# Patient Record
Sex: Male | Born: 1935 | Race: White | Hispanic: No | Marital: Married | Smoking: Former smoker
Health system: Southern US, Community
[De-identification: ages and names within clinical notes are randomized; demographics above are authoritative.]

## PROBLEM LIST (undated history)

## (undated) DIAGNOSIS — I350 Nonrheumatic aortic (valve) stenosis: Secondary | ICD-10-CM

## (undated) DIAGNOSIS — I10 Essential (primary) hypertension: Secondary | ICD-10-CM

## (undated) DIAGNOSIS — E785 Hyperlipidemia, unspecified: Secondary | ICD-10-CM

## (undated) DIAGNOSIS — I441 Atrioventricular block, second degree: Secondary | ICD-10-CM

## (undated) DIAGNOSIS — R413 Other amnesia: Secondary | ICD-10-CM

## (undated) DIAGNOSIS — Z950 Presence of cardiac pacemaker: Secondary | ICD-10-CM

## (undated) HISTORY — DX: Other amnesia: R41.3

## (undated) HISTORY — PX: APPENDECTOMY: SHX54

## (undated) HISTORY — DX: Hyperlipidemia, unspecified: E78.5

## (undated) HISTORY — DX: Nonrheumatic aortic (valve) stenosis: I35.0

## (undated) HISTORY — DX: Atrioventricular block, second degree: I44.1

## (undated) HISTORY — DX: Essential (primary) hypertension: I10

## (undated) HISTORY — DX: Presence of cardiac pacemaker: Z95.0

---

## 2004-09-04 ENCOUNTER — Ambulatory Visit: Payer: Self-pay | Admitting: Internal Medicine

## 2004-10-10 ENCOUNTER — Ambulatory Visit: Payer: Self-pay | Admitting: Internal Medicine

## 2005-01-16 ENCOUNTER — Ambulatory Visit: Payer: Self-pay | Admitting: Internal Medicine

## 2005-02-12 ENCOUNTER — Ambulatory Visit: Payer: Self-pay | Admitting: Internal Medicine

## 2005-07-14 ENCOUNTER — Ambulatory Visit: Payer: Self-pay | Admitting: Internal Medicine

## 2005-08-14 ENCOUNTER — Ambulatory Visit: Payer: Self-pay | Admitting: Internal Medicine

## 2005-09-25 ENCOUNTER — Ambulatory Visit: Payer: Self-pay | Admitting: Internal Medicine

## 2005-11-26 ENCOUNTER — Ambulatory Visit: Payer: Self-pay | Admitting: Internal Medicine

## 2006-02-23 ENCOUNTER — Ambulatory Visit: Payer: Self-pay | Admitting: Internal Medicine

## 2006-07-21 ENCOUNTER — Ambulatory Visit: Payer: Self-pay | Admitting: Internal Medicine

## 2006-07-28 ENCOUNTER — Ambulatory Visit: Payer: Self-pay | Admitting: Internal Medicine

## 2006-10-27 ENCOUNTER — Ambulatory Visit: Payer: Self-pay | Admitting: Internal Medicine

## 2006-10-27 LAB — CONVERTED CEMR LAB: LDL DIRECT: 195 mg/dL

## 2006-11-03 ENCOUNTER — Ambulatory Visit: Payer: Self-pay | Admitting: Internal Medicine

## 2006-12-28 ENCOUNTER — Ambulatory Visit: Payer: Self-pay | Admitting: Internal Medicine

## 2006-12-28 LAB — CONVERTED CEMR LAB
Bilirubin, Direct: 0.1 mg/dL (ref 0.0–0.3)
Cholesterol: 300 mg/dL (ref 0–200)
HDL: 64.5 mg/dL (ref >39.0)
Total CHOL/HDL Ratio: 4.7
Total Protein: 6.1 g/dL (ref 6.0–8.3)
Triglycerides: 110 mg/dL (ref 0–149)

## 2007-01-04 ENCOUNTER — Ambulatory Visit: Payer: Self-pay | Admitting: Internal Medicine

## 2007-03-15 ENCOUNTER — Ambulatory Visit: Payer: Self-pay | Admitting: Internal Medicine

## 2007-03-15 LAB — CONVERTED CEMR LAB
Cholesterol: 284 mg/dL (ref 0–200)
HDL: 58.2 mg/dL (ref >39.0)

## 2007-03-22 ENCOUNTER — Ambulatory Visit: Payer: Self-pay | Admitting: Internal Medicine

## 2007-05-06 ENCOUNTER — Encounter: Payer: Self-pay | Admitting: Internal Medicine

## 2007-05-24 ENCOUNTER — Ambulatory Visit: Payer: Self-pay | Admitting: Internal Medicine

## 2007-05-24 DIAGNOSIS — E785 Hyperlipidemia, unspecified: Secondary | ICD-10-CM | POA: Insufficient documentation

## 2007-05-24 DIAGNOSIS — T887XXA Unspecified adverse effect of drug or medicament, initial encounter: Secondary | ICD-10-CM

## 2007-05-24 LAB — CONVERTED CEMR LAB
Albumin: 3.6 g/dL (ref 3.5–5.2)
Alkaline Phosphatase: 46 units/L (ref 39–117)
CRP, High Sensitivity: 1 (ref 0.00–5.00)
Direct LDL: 168.8 mg/dL
HDL: 61.8 mg/dL (ref >39.0)
Hemoglobin: 14.7 g/dL (ref 13.0–17.0)
Total CHOL/HDL Ratio: 4.2
Total Protein: 6.5 g/dL (ref 6.0–8.3)
Triglycerides: 107 mg/dL (ref 0–149)

## 2007-05-31 ENCOUNTER — Ambulatory Visit: Payer: Self-pay | Admitting: Internal Medicine

## 2007-05-31 ENCOUNTER — Telehealth: Payer: Self-pay | Admitting: Internal Medicine

## 2007-05-31 DIAGNOSIS — R413 Other amnesia: Secondary | ICD-10-CM

## 2007-05-31 DIAGNOSIS — I10 Essential (primary) hypertension: Secondary | ICD-10-CM | POA: Insufficient documentation

## 2007-08-24 ENCOUNTER — Ambulatory Visit: Payer: Self-pay | Admitting: Internal Medicine

## 2007-08-24 LAB — CONVERTED CEMR LAB
ALT: 16 units/L (ref 0–53)
Basophils Relative: 0.9 % (ref 0.0–1.0)
Bilirubin, Direct: 0.1 mg/dL (ref 0.0–0.3)
CO2: 32 meq/L (ref 19–32)
Calcium: 9.6 mg/dL (ref 8.4–10.5)
Eosinophils Absolute: 0.4 10*3/uL (ref 0.0–0.6)
Eosinophils Relative: 6.5 % — ABNORMAL HIGH (ref 0.0–5.0)
GFR calc Af Amer: 94 mL/min
GFR calc non Af Amer: 78 mL/min
Glucose, Bld: 99 mg/dL (ref 70–99)
HDL: 58.6 mg/dL (ref >39.0)
Hemoglobin: 15.3 g/dL (ref 13.0–17.0)
Ketones, urine, test strip: NEGATIVE
Lymphocytes Relative: 23.1 % (ref 12.0–46.0)
MCV: 99 fL (ref 78.0–100.0)
Monocytes Absolute: 1 10*3/uL — ABNORMAL HIGH (ref 0.2–0.7)
Neutro Abs: 2.9 10*3/uL (ref 1.4–7.7)
Neutrophils Relative %: 51.6 % (ref 43.0–77.0)
Nitrite: NEGATIVE
PSA: 0.22 ng/mL (ref 0.10–4.00)
Platelets: 195 10*3/uL (ref 150–400)
Potassium: 5.2 meq/L — ABNORMAL HIGH (ref 3.5–5.1)
Sodium: 143 meq/L (ref 135–145)
Specific Gravity, Urine: 1.025
TSH: 1.92 microintl units/mL (ref 0.35–5.50)
Total Protein: 6.3 g/dL (ref 6.0–8.3)
VLDL: 24 mg/dL (ref 0–40)
WBC: 5.7 10*3/uL (ref 4.5–10.5)
pH: 5.5

## 2007-08-30 ENCOUNTER — Ambulatory Visit: Payer: Self-pay | Admitting: Internal Medicine

## 2007-11-22 ENCOUNTER — Ambulatory Visit: Payer: Self-pay | Admitting: Internal Medicine

## 2007-11-22 LAB — CONVERTED CEMR LAB
ALT: 15 units/L (ref 0–53)
AST: 18 units/L (ref 0–37)
Alkaline Phosphatase: 48 units/L (ref 39–117)
Bilirubin, Direct: 0.2 mg/dL (ref 0.0–0.3)
Cholesterol: 288 mg/dL (ref 0–200)
Total CHOL/HDL Ratio: 4.6
Total Protein: 6.2 g/dL (ref 6.0–8.3)

## 2007-12-01 ENCOUNTER — Ambulatory Visit: Payer: Self-pay | Admitting: Internal Medicine

## 2007-12-08 ENCOUNTER — Telehealth: Payer: Self-pay | Admitting: Internal Medicine

## 2008-02-23 ENCOUNTER — Ambulatory Visit: Payer: Self-pay | Admitting: Internal Medicine

## 2008-02-23 LAB — CONVERTED CEMR LAB
Direct LDL: 233.9 mg/dL
HDL: 53.7 mg/dL (ref >39.0)
TSH: 1.55 microintl units/mL (ref 0.35–5.50)
Total CHOL/HDL Ratio: 5.7
Triglycerides: 94 mg/dL (ref 0–149)

## 2008-03-01 ENCOUNTER — Ambulatory Visit: Payer: Self-pay | Admitting: Internal Medicine

## 2008-03-01 LAB — CONVERTED CEMR LAB: LDL Goal: 130 mg/dL

## 2008-05-29 ENCOUNTER — Ambulatory Visit: Payer: Self-pay | Admitting: Internal Medicine

## 2008-05-29 LAB — CONVERTED CEMR LAB
Alkaline Phosphatase: 45 units/L (ref 39–117)
Bilirubin, Direct: 0.1 mg/dL (ref 0.0–0.3)
HDL: 60.4 mg/dL (ref >39.0)
Total Bilirubin: 1.1 mg/dL (ref 0.3–1.2)
VLDL: 22 mg/dL (ref 0–40)

## 2008-06-05 ENCOUNTER — Ambulatory Visit: Payer: Self-pay | Admitting: Internal Medicine

## 2008-07-26 ENCOUNTER — Ambulatory Visit: Payer: Self-pay | Admitting: Internal Medicine

## 2008-09-10 ENCOUNTER — Ambulatory Visit: Payer: Self-pay | Admitting: Internal Medicine

## 2008-09-10 LAB — CONVERTED CEMR LAB
ALT: 15 units/L (ref 0–53)
Basophils Absolute: 0.1 10*3/uL (ref 0.0–0.1)
Bilirubin, Direct: 0.1 mg/dL (ref 0.0–0.3)
Blood in Urine, dipstick: NEGATIVE
CO2: 31 meq/L (ref 19–32)
Calcium: 9.4 mg/dL (ref 8.4–10.5)
Cholesterol: 296 mg/dL (ref 0–200)
Glucose, Urine, Semiquant: NEGATIVE
HDL: 57.4 mg/dL (ref >39.0)
Lymphocytes Relative: 28.9 % (ref 12.0–46.0)
MCHC: 33.9 g/dL (ref 30.0–36.0)
Neutro Abs: 2.2 10*3/uL (ref 1.4–7.7)
Neutrophils Relative %: 43.9 % (ref 43.0–77.0)
PSA: 0.33 ng/mL (ref 0.10–4.00)
Platelets: 178 10*3/uL (ref 150–400)
Potassium: 4.7 meq/L (ref 3.5–5.1)
RDW: 12.6 % (ref 11.5–14.6)
Sodium: 144 meq/L (ref 135–145)
TSH: 2.39 microintl units/mL (ref 0.35–5.50)
Total Bilirubin: 0.7 mg/dL (ref 0.3–1.2)
Triglycerides: 109 mg/dL (ref 0–149)
Urobilinogen, UA: 0.2
VLDL: 22 mg/dL (ref 0–40)
pH: 5.5

## 2008-09-17 ENCOUNTER — Ambulatory Visit: Payer: Self-pay | Admitting: Internal Medicine

## 2008-11-05 ENCOUNTER — Telehealth: Payer: Self-pay | Admitting: Internal Medicine

## 2008-11-05 ENCOUNTER — Encounter: Payer: Self-pay | Admitting: Internal Medicine

## 2009-01-01 ENCOUNTER — Ambulatory Visit: Payer: Self-pay | Admitting: Internal Medicine

## 2009-01-01 LAB — CONVERTED CEMR LAB
ALT: 16 units/L (ref 0–53)
Direct LDL: 163.1 mg/dL
HDL: 54.1 mg/dL (ref >39.00)
Total Bilirubin: 1.1 mg/dL (ref 0.3–1.2)
Total Protein: 6.1 g/dL (ref 6.0–8.3)
Triglycerides: 98 mg/dL (ref 0.0–149.0)

## 2009-01-17 ENCOUNTER — Ambulatory Visit: Payer: Self-pay | Admitting: Internal Medicine

## 2009-01-17 DIAGNOSIS — N401 Enlarged prostate with lower urinary tract symptoms: Secondary | ICD-10-CM | POA: Insufficient documentation

## 2009-04-16 ENCOUNTER — Telehealth: Payer: Self-pay | Admitting: Internal Medicine

## 2009-04-23 ENCOUNTER — Ambulatory Visit: Payer: Self-pay | Admitting: Internal Medicine

## 2009-04-23 LAB — CONVERTED CEMR LAB
BUN: 20 mg/dL (ref 6–23)
Calcium: 8.9 mg/dL (ref 8.4–10.5)
GFR calc non Af Amer: 87.11 mL/min (ref >60)
Glucose, Bld: 94 mg/dL (ref 70–99)
HDL: 61.7 mg/dL (ref >39.00)

## 2009-06-27 ENCOUNTER — Telehealth: Payer: Self-pay | Admitting: Internal Medicine

## 2009-07-15 ENCOUNTER — Telehealth: Payer: Self-pay | Admitting: Internal Medicine

## 2009-07-30 ENCOUNTER — Ambulatory Visit: Payer: Self-pay | Admitting: Internal Medicine

## 2009-10-16 ENCOUNTER — Ambulatory Visit: Payer: Self-pay | Admitting: Internal Medicine

## 2009-10-16 LAB — CONVERTED CEMR LAB
ALT: 14 units/L (ref 0–53)
Alkaline Phosphatase: 49 units/L (ref 39–117)
Basophils Relative: 0.8 % (ref 0.0–3.0)
Bilirubin, Direct: 0.2 mg/dL (ref 0.0–0.3)
Calcium: 9.1 mg/dL (ref 8.4–10.5)
Creatinine, Ser: 0.9 mg/dL (ref 0.4–1.5)
Direct LDL: 186.5 mg/dL
Eosinophils Absolute: 0.4 10*3/uL (ref 0.0–0.7)
Eosinophils Relative: 7.2 % — ABNORMAL HIGH (ref 0.0–5.0)
Lymphocytes Relative: 22.7 % (ref 12.0–46.0)
Neutrophils Relative %: 56.1 % (ref 43.0–77.0)
RBC: 4.46 M/uL (ref 4.22–5.81)
Total CHOL/HDL Ratio: 4
Total Protein: 6.6 g/dL (ref 6.0–8.3)
Triglycerides: 103 mg/dL (ref 0.0–149.0)
VLDL: 20.6 mg/dL (ref 0.0–40.0)
WBC: 5.9 10*3/uL (ref 4.5–10.5)

## 2010-01-14 ENCOUNTER — Ambulatory Visit: Payer: Self-pay | Admitting: Internal Medicine

## 2010-02-03 ENCOUNTER — Encounter: Payer: Self-pay | Admitting: Internal Medicine

## 2010-02-03 ENCOUNTER — Ambulatory Visit (HOSPITAL_COMMUNITY): Admission: RE | Admit: 2010-02-03 | Discharge: 2010-02-03 | Payer: Self-pay | Admitting: Internal Medicine

## 2010-02-03 ENCOUNTER — Ambulatory Visit: Payer: Self-pay | Admitting: Cardiology

## 2010-02-03 ENCOUNTER — Ambulatory Visit: Payer: Self-pay

## 2010-04-03 ENCOUNTER — Ambulatory Visit: Payer: Self-pay | Admitting: Internal Medicine

## 2010-04-03 LAB — CONVERTED CEMR LAB
ALT: 10 units/L (ref 0–53)
AST: 18 units/L (ref 0–37)
Alkaline Phosphatase: 46 units/L (ref 39–117)
Bilirubin, Direct: 0.2 mg/dL (ref 0.0–0.3)
HDL: 61 mg/dL (ref >39.00)
Total Protein: 6 g/dL (ref 6.0–8.3)

## 2010-04-15 ENCOUNTER — Ambulatory Visit: Payer: Self-pay | Admitting: Internal Medicine

## 2010-07-08 ENCOUNTER — Ambulatory Visit: Payer: Self-pay | Admitting: Internal Medicine

## 2010-07-08 LAB — CONVERTED CEMR LAB
ALT: 12 units/L (ref 0–53)
AST: 18 units/L (ref 0–37)
Alkaline Phosphatase: 51 units/L (ref 39–117)
Bilirubin, Direct: 0.1 mg/dL (ref 0.0–0.3)
CO2: 28 meq/L (ref 19–32)
Chloride: 108 meq/L (ref 96–112)
Cholesterol: 234 mg/dL — ABNORMAL HIGH (ref 0–200)
Potassium: 4.7 meq/L (ref 3.5–5.1)
Sodium: 142 meq/L (ref 135–145)
Total Bilirubin: 0.7 mg/dL (ref 0.3–1.2)
Total CHOL/HDL Ratio: 4
Total Protein: 5.9 g/dL — ABNORMAL LOW (ref 6.0–8.3)

## 2010-07-15 ENCOUNTER — Ambulatory Visit: Payer: Self-pay | Admitting: Internal Medicine

## 2010-10-14 ENCOUNTER — Ambulatory Visit: Payer: Self-pay | Admitting: Internal Medicine

## 2010-10-14 LAB — CONVERTED CEMR LAB
Albumin: 3.4 g/dL — ABNORMAL LOW (ref 3.5–5.2)
Alkaline Phosphatase: 51 units/L (ref 39–117)
Basophils Relative: 0.8 % (ref 0.0–3.0)
Bilirubin Urine: NEGATIVE
CO2: 30 meq/L (ref 19–32)
Chloride: 104 meq/L (ref 96–112)
Direct LDL: 219.1 mg/dL
Eosinophils Absolute: 0.5 10*3/uL (ref 0.0–0.7)
Hemoglobin: 15.3 g/dL (ref 13.0–17.0)
MCHC: 33.9 g/dL (ref 30.0–36.0)
MCV: 98.2 fL (ref 78.0–100.0)
Monocytes Absolute: 0.8 10*3/uL (ref 0.1–1.0)
Neutro Abs: 2.7 10*3/uL (ref 1.4–7.7)
Nitrite: NEGATIVE
RBC: 4.58 M/uL (ref 4.22–5.81)
Sodium: 142 meq/L (ref 135–145)
Total CHOL/HDL Ratio: 5
Total Protein: 6.1 g/dL (ref 6.0–8.3)
Urobilinogen, UA: 0.2

## 2010-10-27 ENCOUNTER — Ambulatory Visit
Admission: RE | Admit: 2010-10-27 | Discharge: 2010-10-27 | Payer: Self-pay | Source: Home / Self Care | Attending: Internal Medicine | Admitting: Internal Medicine

## 2010-10-27 DIAGNOSIS — J309 Allergic rhinitis, unspecified: Secondary | ICD-10-CM | POA: Insufficient documentation

## 2010-11-19 NOTE — Assessment & Plan Note (Signed)
Summary: 3 month rov/njr   Vital Signs:  Patient profile:   77 year old male Height:      67 inches Weight:      194 pounds BMI:     30.49 Temp:     98.2 degrees F oral Pulse rate:   56 / minute Resp:     14 per minute BP sitting:   120 / 78  (left arm)  Vitals Entered By: Issiah Eddy, LPN (July 15, 2010 9:58 AM)  Nutrition Counseling: Patient's BMI is greater than 25 and therefore counseled on weight management options. CC: roa labs, Lipid Management Is Patient Diabetic? No   Primary Care Tyrique Sporn:  Stacie Glaze MD  CC:  roa labs and Lipid Management.  History of Present Illness: Follow up change in the blood pressure lipids and reviewed memory disorder   Hyperlipidemia Follow-Up      This is a 77 year old man who presents for Hyperlipidemia follow-up.  changed to fish oil could not tolerate the statin class.  The patient denies muscle aches, GI upset, abdominal pain, flushing, itching, constipation, diarrhea, and fatigue.  The patient denies the following symptoms: chest pain/pressure, exercise intolerance, dypsnea, palpitations, syncope, and pedal edema.  Compliance with medications (by patient report) has been near 100%.  Dietary compliance has been fair.  The patient reports exercising occasionally.  Adjunctive measures currently used by the patient include niacin and fish oil supplements.    Lipid Management History:      Positive NCEP/ATP III risk factors include male age 65 years old or older and hypertension.  Negative NCEP/ATP III risk factors include non-tobacco-user status.     Preventive Screening-Counseling & Management  Alcohol-Tobacco     Smoking Status: quit > 6 months     Passive Smoke Exposure: yes     Tobacco Counseling: to remain off tobacco products     Passive Smoke Counseling: to avoid passive smoke exposure  Comments: never smoked continuously, at work  Problems Prior to Update: 1)  Murmur  (ICD-785.2) 2)  Benign Prostatic  Hypertrophy, With Obstruction  (ICD-600.01) 3)  Physical Examination  (ICD-V70.0) 4)  Symptom, Memory Loss  (ICD-780.93) 5)  Hypertension  (ICD-401.9) 6)  Hyperlipidemia  (ICD-272.4) 7)  Advef, Drug/medicinal/biological Subst Nos  (ICD-995.20)  Current Problems (verified): 1)  Murmur  (ICD-785.2) 2)  Benign Prostatic Hypertrophy, With Obstruction  (ICD-600.01) 3)  Physical Examination  (ICD-V70.0) 4)  Symptom, Memory Loss  (ICD-780.93) 5)  Hypertension  (ICD-401.9) 6)  Hyperlipidemia  (ICD-272.4) 7)  Advef, Drug/medicinal/biological Subst Nos  (ICD-995.20)  Medications Prior to Update: 1)  Gingo Biloba .... Once Daily 2)  Saw Palmetto .... Once Daily 3)  Calcium 600/vitamin D 600-200 Mg-Unit  Tabs (Calcium Carbonate-Vitamin D) .... Once Daily 4)  Bystolic 5 Mg Tabs (Nebivolol Hcl) .Marland Kitchen.. 1 Once Daily 5)  Livalo 2 Mg Tabs (Pitavastatin Calcium) .... One By Mouth Daily 6)  Phosphatidylserine 7)  Fish Oil Concentrate 1000 Mg Caps (Omega-3 Fatty Acids) .... Two By Mouth Two Times A Day 8)  Aricept 5 Mg Tabs (Donepezil Hcl) .... One By Mouth Daily 9)  Cerefolin Nac 6-2-600 Mg Tabs (Methylfol-Methylcob-Acetylcyst) .Marland Kitchen.. 1 Once Daily  Current Medications (verified): 1)  Gingo Biloba .... Once Daily 2)  Saw Palmetto .... Once Daily 3)  Calcium 600/vitamin D 600-200 Mg-Unit  Tabs (Calcium Carbonate-Vitamin D) .... Once Daily 4)  Bystolic 5 Mg Tabs (Nebivolol Hcl) .Marland Kitchen.. 1 Once Daily 5)  Livalo 2 Mg Tabs (Pitavastatin Calcium) .Marland KitchenMarland KitchenMarland Kitchen  One By Mouth Daily 6)  Phosphatidylserine 7)  Fish Oil Concentrate 1000 Mg Caps (Omega-3 Fatty Acids) .... Two By Mouth Two Times A Day 8)  Aricept 5 Mg Tabs (Donepezil Hcl) .... One By Mouth Daily 9)  Cerefolin Nac 6-2-600 Mg Tabs (Methylfol-Methylcob-Acetylcyst) .Marland Kitchen.. 1 Once Daily  Allergies (verified): 1)  Amoxicillin (Amoxicillin) 2)  * Statin  Past History:  Family History: Last updated: 05/31/2007 father passed at 66 mother  died at 22 brother with  memory problems  Social History: Last updated: 05/31/2007 Retired Married Former Smoker  Risk Factors: Smoking Status: quit > 6 months (07/15/2010) Passive Smoke Exposure: yes (07/15/2010)  Past medical, surgical, family and social histories (including risk factors) reviewed, and no changes noted (except as noted below).  Past Medical History: Reviewed history from 09/17/2008 and no changes required. Hyperlipidemia Hypertension memory loss  Past Surgical History: Reviewed history from 05/31/2007 and no changes required. Appendectomy  Family History: Reviewed history from 05/31/2007 and no changes required. father passed at 72 mother  died at 45 brother with memory problems  Social History: Reviewed history from 05/31/2007 and no changes required. Retired Married Former Smoker Smoking Status:  quit > 6 months Passive Smoke Exposure:  yes  Review of Systems  The patient denies anorexia, fever, weight loss, weight gain, vision loss, decreased hearing, hoarseness, chest pain, syncope, dyspnea on exertion, peripheral edema, prolonged cough, headaches, hemoptysis, abdominal pain, melena, hematochezia, severe indigestion/heartburn, hematuria, incontinence, genital sores, muscle weakness, suspicious skin lesions, transient blindness, difficulty walking, depression, unusual weight change, abnormal bleeding, enlarged lymph nodes, angioedema, and breast masses.         Flu Vaccine Consent Questions     Do you have a history of severe allergic reactions to this vaccine? no    Any prior history of allergic reactions to egg and/or gelatin? no    Do you have a sensitivity to the preservative Thimersol? no    Do you have a past history of Guillan-Barre Syndrome? no    Do you currently have an acute febrile illness? no    Have you ever had a severe reaction to latex? no    Vaccine information given and explained to patient? yes    Are you currently pregnant? no    Lot  Number:[id number]   Exp Date:04/18/2011   Site Given  Left Deltoid IM    Physical Exam  General:  Well-developed,well-nourished,in no acute distress; alert,appropriate and cooperative throughout examination Head:  Normocephalic and atraumatic without obvious abnormalities. No apparent alopecia or balding. Eyes:  pupils equal and pupils round.   Ears:  R ear normal and L ear normal.   Nose:  mucosal erythema and mucosal edema.   Mouth:  pharyngeal erythema, pharyngeal exudate, posterior lymphoid hypertrophy, and postnasal drip.   Neck:  No deformities, masses, or tenderness noted. Lungs:  Normal respiratory effort, chest expands symmetrically. Lungs are clear to auscultation, no crackles or wheezes. Heart:  Normal rate and regular rhythm. 60'Sgrade  3/6 systolic murmur.  ( WITH THRILL) Abdomen:  Bowel sounds positive,abdomen soft and non-tender without masses, organomegaly or hernias noted. Msk:  No deformity or scoliosis noted of thoracic or lumbar spine.   Pulses:  R and L carotid,radial,femoral,dorsalis pedis and posterior tibial pulses are full and equal bilaterally Extremities:  No clubbing, cyanosis, edema, or deformity noted with normal full range of motion of all joints.     Impression & Recommendations:  Problem # 1:  HYPERLIPIDEMIA (ICD-272.4) on fish oil  His updated  medication list for this problem includes:    Livalo 2 Mg Tabs (Pitavastatin calcium) ..... One by mouth daily  Labs Reviewed: SGOT: 18 (07/08/2010)   SGPT: 12 (07/08/2010)  Lipid Goals: Chol Goal: 200 (03/01/2008)   HDL Goal: 40 (03/01/2008)   LDL Goal: 130 (03/01/2008)   TG Goal: 150 (03/01/2008)  Prior 10 Yr Risk Heart Disease: Not enough information (01/17/2009)   HDL:55.10 (07/08/2010), 61.00 (04/03/2010)  LDL:DEL (09/10/2008), DEL (05/29/2008)  Chol:234 (07/08/2010), 302 (04/03/2010)  Trig:92.0 (07/08/2010), 110.0 (04/03/2010)  Problem # 2:  HYPERTENSION (ICD-401.9)  His updated medication list for  this problem includes:    Bystolic 5 Mg Tabs (Nebivolol hcl) .Marland Kitchen... 1 once daily  BP today: 120/78 Prior BP: 124/68 (04/15/2010)  Prior 10 Yr Risk Heart Disease: Not enough information (01/17/2009)  Labs Reviewed: K+: 4.7 (07/08/2010) Creat: : 0.9 (07/08/2010)   Chol: 234 (07/08/2010)   HDL: 55.10 (07/08/2010)   LDL: DEL (09/10/2008)   TG: 92.0 (07/08/2010)  Problem # 3:  SYMPTOM, MEMORY LOSS (ICD-780.93)  progresive  Orders: Venipuncture (16109) TLB-B12 + Folate Pnl ([id number])  Complete Medication List: 1)  Gingo Biloba  .... Once daily 2)  Saw Palmetto  .... Once daily 3)  Calcium 600/vitamin D 600-200 Mg-unit Tabs (Calcium carbonate-vitamin d) .... Once daily 4)  Bystolic 5 Mg Tabs (Nebivolol hcl) .Marland Kitchen.. 1 once daily 5)  Livalo 2 Mg Tabs (Pitavastatin calcium) .... One by mouth daily 6)  Phosphatidylserine  7)  Fish Oil Concentrate 1000 Mg Caps (Omega-3 fatty acids) .... Two by mouth two times a day 8)  Aricept 5 Mg Tabs (Donepezil hcl) .... One by mouth daily 9)  Cerefolin Nac 6-2-600 Mg Tabs (Methylfol-methylcob-acetylcyst) .Marland Kitchen.. 1 once daily 10)  Niacin Flush Free 500 Mg Caps (Inositol niacinate) .... One by mouth daily with low fat snak stop it he complains of itching  Other Orders: Flu Vaccine 54yrs + MEDICARE PATIENTS (N5621) Administration Flu vaccine - MCR (G0008)  Lipid Assessment/Plan:      Based on NCEP/ATP III, the patient's risk factor category is "2 or more risk factors and a calculated 10 year CAD risk of > 20%".  The patient's lipid goals are as follows: Total cholesterol goal is 200; LDL cholesterol goal is 130; HDL cholesterol goal is 40; Triglyceride goal is 150.    Patient Instructions: 1)  use the saline nasal wash every day 2)  may use niacin added to the fish 3)  Please schedule a follow-up appointment in 3 months.  Appended Document: Orders Update    Clinical Lists Changes  Orders: Added new Service order of Specimen Handling ([id number])  - Signed

## 2010-11-19 NOTE — Assessment & Plan Note (Signed)
Summary: 3 month rov/njr/pt rsc from bmp/cjr   Vital Signs:  Patient profile:   77 year old male Height:      67 inches Weight:      192 pounds BMI:     30.18 Temp:     98.2 degrees F oral Pulse rate:   48 / minute Resp:     14 per minute BP sitting:   124 / 68  (left arm)  Vitals Entered By: Jaizon Eddy, LPN (April 15, 2010 11:15 AM) CC: roa labs-stopped finasteride caused rash, Lipid Management, Hypertension Management   CC:  roa labs-stopped finasteride caused rash, Lipid Management, and Hypertension Management.  History of Present Illness: the pt had a rash form the finesteride the frequency has increased the blood pressure is good his wife has many commments about the side effects of bystollic with less erection his demetia is stable his lipid are not a goal   Hypertension History:      He denies headache, chest pain, palpitations, dyspnea with exertion, orthopnea, PND, peripheral edema, visual symptoms, neurologic problems, syncope, and side effects from treatment.        Positive major cardiovascular risk factors include male age 40 years old or older, hyperlipidemia, and hypertension.  Negative major cardiovascular risk factors include non-tobacco-user status.    Lipid Management History:      Positive NCEP/ATP III risk factors include male age 19 years old or older and hypertension.  Negative NCEP/ATP III risk factors include HDL cholesterol greater than 60 and non-tobacco-user status.      Preventive Screening-Counseling & Management  Alcohol-Tobacco     Smoking Status: quit  Problems Prior to Update: 1)  Murmur  (ICD-785.2) 2)  Benign Prostatic Hypertrophy, With Obstruction  (ICD-600.01) 3)  Physical Examination  (ICD-V70.0) 4)  Symptom, Memory Loss  (ICD-780.93) 5)  Hypertension  (ICD-401.9) 6)  Hyperlipidemia  (ICD-272.4) 7)  Advef, Drug/medicinal/biological Subst Nos  (ICD-995.20)  Current Problems (verified): 1)  Murmur  (ICD-785.2) 2)  Benign  Prostatic Hypertrophy, With Obstruction  (ICD-600.01) 3)  Physical Examination  (ICD-V70.0) 4)  Symptom, Memory Loss  (ICD-780.93) 5)  Hypertension  (ICD-401.9) 6)  Hyperlipidemia  (ICD-272.4) 7)  Advef, Drug/medicinal/biological Subst Nos  (ICD-995.20)  Medications Prior to Update: 1)  Gingo Biloba .... Once Daily 2)  Saw Palmetto .... Once Daily 3)  Calcium 600/vitamin D 600-200 Mg-Unit  Tabs (Calcium Carbonate-Vitamin D) .... Once Daily 4)  Bystolic 5 Mg Tabs (Nebivolol Hcl) .Marland Kitchen.. 1 Once Daily 5)  Red Yeast Rice 600 Mg Tabs (Red Yeast Rice Extract) .... One By Mouth Two Times A Day To Tid 6)  Phosphatidylserine 7)  Fish Oil Concentrate 1000 Mg Caps (Omega-3 Fatty Acids) .... Two By Mouth Two Times A Day 8)  Finasteride 5 Mg Tabs (Finasteride) .... Take One By Mouth Daily 9)  Aricept 5 Mg Tabs (Donepezil Hcl) .... One By Mouth Daily 10)  Cerefolin Nac 6-2-600 Mg Tabs (Methylfol-Methylcob-Acetylcyst) .Marland Kitchen.. 1 Once Daily  Current Medications (verified): 1)  Gingo Biloba .... Once Daily 2)  Saw Palmetto .... Once Daily 3)  Calcium 600/vitamin D 600-200 Mg-Unit  Tabs (Calcium Carbonate-Vitamin D) .... Once Daily 4)  Bystolic 5 Mg Tabs (Nebivolol Hcl) .Marland Kitchen.. 1 Once Daily 5)  Livalo 2 Mg Tabs (Pitavastatin Calcium) .... One By Mouth Daily 6)  Phosphatidylserine 7)  Fish Oil Concentrate 1000 Mg Caps (Omega-3 Fatty Acids) .... Two By Mouth Two Times A Day 8)  Aricept 5 Mg Tabs (Donepezil Hcl) .... One  By Mouth Daily 9)  Cerefolin Nac 6-2-600 Mg Tabs (Methylfol-Methylcob-Acetylcyst) .Marland Kitchen.. 1 Once Daily  Allergies (verified): 1)  Amoxicillin (Amoxicillin) 2)  * Statin  Past History:  Family History: Last updated: 05/31/2007 father passed at 12 mother  died at 57 brother with memory problems  Social History: Last updated: 05/31/2007 Retired Married Former Smoker  Risk Factors: Smoking Status: quit (04/15/2010)  Past medical, surgical, family and social histories (including risk  factors) reviewed, and no changes noted (except as noted below).  Past Medical History: Reviewed history from 09/17/2008 and no changes required. Hyperlipidemia Hypertension memory loss  Past Surgical History: Reviewed history from 05/31/2007 and no changes required. Appendectomy  Family History: Reviewed history from 05/31/2007 and no changes required. father passed at 48 mother  died at 62 brother with memory problems  Social History: Reviewed history from 05/31/2007 and no changes required. Retired Married Former Smoker  Review of Systems  The patient denies anorexia, fever, weight loss, weight gain, vision loss, decreased hearing, hoarseness, chest pain, syncope, dyspnea on exertion, peripheral edema, prolonged cough, headaches, hemoptysis, abdominal pain, melena, hematochezia, severe indigestion/heartburn, hematuria, incontinence, genital sores, muscle weakness, suspicious skin lesions, transient blindness, difficulty walking, depression, unusual weight change, abnormal bleeding, enlarged lymph nodes, angioedema, breast masses, and testicular masses.    Physical Exam  General:  Well-developed,well-nourished,in no acute distress; alert,appropriate and cooperative throughout examination Head:  Normocephalic and atraumatic without obvious abnormalities. No apparent alopecia or balding. Eyes:  pupils equal and pupils round.   Ears:  R ear normal and L ear normal.   Nose:  mucosal erythema and mucosal edema.   Mouth:  pharyngeal erythema, pharyngeal exudate, posterior lymphoid hypertrophy, and postnasal drip.   Neck:  No deformities, masses, or tenderness noted. Lungs:  Normal respiratory effort, chest expands symmetrically. Lungs are clear to auscultation, no crackles or wheezes. Heart:  Normal rate and regular rhythm. 60'Sgrade  3/6 systolic murmur.  ( WITH THRILL) Abdomen:  Bowel sounds positive,abdomen soft and non-tender without masses, organomegaly or hernias  noted.   Impression & Recommendations:  Problem # 1:  HYPERTENSION (ICD-401.9)  His updated medication list for this problem includes:    Bystolic 5 Mg Tabs (Nebivolol hcl) .Marland Kitchen... 1 once daily  BP today: 124/68 Prior BP: 120/72 (01/14/2010)  Prior 10 Yr Risk Heart Disease: Not enough information (01/17/2009)  Labs Reviewed: K+: 4.3 (10/16/2009) Creat: : 0.9 (10/16/2009)   Chol: 302 (04/03/2010)   HDL: 61.00 (04/03/2010)   LDL: DEL (09/10/2008)   TG: 110.0 (04/03/2010)  Problem # 2:  HYPERLIPIDEMIA (ICD-272.4) no at goal His updated medication list for this problem includes:    Livalo 2 Mg Tabs (Pitavastatin calcium) ..... One by mouth daily  Labs Reviewed: SGOT: 18 (04/03/2010)   SGPT: 10 (04/03/2010)  Lipid Goals: Chol Goal: 200 (03/01/2008)   HDL Goal: 40 (03/01/2008)   LDL Goal: 130 (03/01/2008)   TG Goal: 150 (03/01/2008)  Prior 10 Yr Risk Heart Disease: Not enough information (01/17/2009)   HDL:61.00 (04/03/2010), 59.50 (10/16/2009)  LDL:DEL (09/10/2008), DEL (05/29/2008)  Chol:302 (04/03/2010), 266 (10/16/2009)  Trig:110.0 (04/03/2010), 103.0 (10/16/2009)  Problem # 3:  SYMPTOM, MEMORY LOSS (ICD-780.93)  Complete Medication List: 1)  Gingo Biloba  .... Once daily 2)  Saw Palmetto  .... Once daily 3)  Calcium 600/vitamin D 600-200 Mg-unit Tabs (Calcium carbonate-vitamin d) .... Once daily 4)  Bystolic 5 Mg Tabs (Nebivolol hcl) .Marland Kitchen.. 1 once daily 5)  Livalo 2 Mg Tabs (Pitavastatin calcium) .... One by mouth daily 6)  Phosphatidylserine  7)  Fish Oil Concentrate 1000 Mg Caps (Omega-3 fatty acids) .... Two by mouth two times a day 8)  Aricept 5 Mg Tabs (Donepezil hcl) .... One by mouth daily 9)  Cerefolin Nac 6-2-600 Mg Tabs (Methylfol-methylcob-acetylcyst) .Marland Kitchen.. 1 once daily  Hypertension Assessment/Plan:      The patient's hypertensive risk group is category B: At least one risk factor (excluding diabetes) with no target organ damage.  Today's blood pressure is 124/68.   His blood pressure goal is < 140/90.  Lipid Assessment/Plan:      Based on NCEP/ATP III, the patient's risk factor category is "0-1 risk factors".  The patient's lipid goals are as follows: Total cholesterol goal is 200; LDL cholesterol goal is 130; HDL cholesterol goal is 40; Triglyceride goal is 150.    Patient Instructions: 1)  Please schedule a follow-up appointment in 3 months. 2)  BMP prior to visit, ICD-9:401.90 3)  Hepatic Panel prior to visit, ICD-9:995.20 4)  Lipid Panel prior to visit, ICD-9:272.4

## 2010-11-19 NOTE — Assessment & Plan Note (Signed)
Summary: 3 mo rov/mm   Vital Signs:  Patient profile:   77 year old male Height:      67 inches Weight:      194 pounds BMI:     30.49 Temp:     98.2 degrees F oral Pulse rate:   60 / minute Resp:     12 per minute BP sitting:   120 / 72  (left arm)  Vitals Entered By: Kingstyn Eddy, LPN (January 14, 2010 9:23 AM)  Nutrition Counseling: Patient's BMI is greater than 25 and therefore counseled on weight management options. CC: roa-c/o slow pulse after work out, Hypertension Management   CC:  roa-c/o slow pulse after work out and Hypertension Management.  History of Present Illness: MENMORY LOSS DISCUSSON I have spent greater that 30 min face to face evaluating this patient INCREASED SOB  Hypertension Follow-Up      This is a 77 year old man who presents for Hypertension follow-up.  The patient denies lightheadedness, urinary frequency, headaches, edema, impotence, rash, and fatigue.  The patient denies the following associated symptoms: chest pain, chest pressure, exercise intolerance, dyspnea, palpitations, syncope, leg edema, and pedal edema.  Compliance with medications (by patient report) has been near 100%.  The patient reports that dietary compliance has been excellent.  The patient reports exercising daily.    Hyperlipidemia Follow-Up      The patient also presents for Hyperlipidemia follow-up.  The patient denies muscle aches, GI upset, abdominal pain, flushing, itching, constipation, diarrhea, and fatigue.  The patient denies the following symptoms: chest pain/pressure, exercise intolerance, dypsnea, palpitations, syncope, and pedal edema.  Compliance with medications (by patient report) has been near 100%.  Dietary compliance has been good.  The patient reports exercising 3-4X per week.  Adjunctive measures currently used by the patient include folic acid, niacin, and fish oil supplements.    Hypertension History:      Positive major cardiovascular risk factors include  male age 75 years old or older, hyperlipidemia, and hypertension.  Negative major cardiovascular risk factors include non-tobacco-user status.     Preventive Screening-Counseling & Management  Alcohol-Tobacco     Smoking Status: quit  Problems Prior to Update: 1)  Benign Prostatic Hypertrophy, With Obstruction  (ICD-600.01) 2)  Physical Examination  (ICD-V70.0) 3)  Symptom, Memory Loss  (ICD-780.93) 4)  Hypertension  (ICD-401.9) 5)  Hyperlipidemia  (ICD-272.4) 6)  Advef, Drug/medicinal/biological Subst Nos  (ICD-995.20)  Current Problems (verified): 1)  Benign Prostatic Hypertrophy, With Obstruction  (ICD-600.01) 2)  Physical Examination  (ICD-V70.0) 3)  Symptom, Memory Loss  (ICD-780.93) 4)  Hypertension  (ICD-401.9) 5)  Hyperlipidemia  (ICD-272.4) 6)  Advef, Drug/medicinal/biological Subst Nos  (ICD-995.20)  Medications Prior to Update: 1)  Gingo Biloba .... Once Daily 2)  Saw Palmetto .... Once Daily 3)  Nasonex 50 Mcg/act  Susp (Mometasone Furoate) .... 2 Spray Each Nostril Every Day As Needed 4)  Calcium 600/vitamin D 600-200 Mg-Unit  Tabs (Calcium Carbonate-Vitamin D) .... Once Daily 5)  Bystolic 5 Mg Tabs (Nebivolol Hcl) .Marland Kitchen.. 1 Once Daily 6)  Red Yeast Rice 600 Mg Tabs (Red Yeast Rice Extract) .... One By Mouth Two Times A Day To Tid 7)  Phosphatidylserine 8)  Fish Oil Concentrate 1000 Mg Caps (Omega-3 Fatty Acids) .... Two By Mouth Two Times A Day 9)  Finasteride 5 Mg Tabs (Finasteride) .... Take One By Mouth Daily 10)  Folic Acid 400 Mcg Tabs (Folic Acid) .... One By Mouth Daily 11)  Aricept  5 Mg Tabs (Donepezil Hcl) .... One By Mouth Daily 12)  Cerefolin Nac 6-2-600 Mg Tabs (Methylfol-Methylcob-Acetylcyst) .Marland Kitchen.. 1 Once Daily  Current Medications (verified): 1)  Gingo Biloba .... Once Daily 2)  Saw Palmetto .... Once Daily 3)  Calcium 600/vitamin D 600-200 Mg-Unit  Tabs (Calcium Carbonate-Vitamin D) .... Once Daily 4)  Bystolic 5 Mg Tabs (Nebivolol Hcl) .Marland Kitchen.. 1 Once  Daily 5)  Red Yeast Rice 600 Mg Tabs (Red Yeast Rice Extract) .... One By Mouth Two Times A Day To Tid 6)  Phosphatidylserine 7)  Fish Oil Concentrate 1000 Mg Caps (Omega-3 Fatty Acids) .... Two By Mouth Two Times A Day 8)  Finasteride 5 Mg Tabs (Finasteride) .... Take One By Mouth Daily 9)  Folic Acid 400 Mcg Tabs (Folic Acid) .... One By Mouth Daily 10)  Aricept 5 Mg Tabs (Donepezil Hcl) .... One By Mouth Daily 11)  Cerefolin Nac 6-2-600 Mg Tabs (Methylfol-Methylcob-Acetylcyst) .Marland Kitchen.. 1 Once Daily  Allergies (verified): 1)  Amoxicillin (Amoxicillin) 2)  * Statin  Past History:  Family History: Last updated: 05/31/2007 father passed at 29 mother  died at 16 brother with memory problems  Social History: Last updated: 05/31/2007 Retired Married Former Smoker  Risk Factors: Smoking Status: quit (01/14/2010)  Past medical, surgical, family and social histories (including risk factors) reviewed, and no changes noted (except as noted below).  Past Medical History: Reviewed history from 09/17/2008 and no changes required. Hyperlipidemia Hypertension memory loss  Past Surgical History: Reviewed history from 05/31/2007 and no changes required. Appendectomy  Family History: Reviewed history from 05/31/2007 and no changes required. father passed at 70 mother  died at 44 brother with memory problems  Social History: Reviewed history from 05/31/2007 and no changes required. Retired Married Former Smoker  Review of Systems  The patient denies anorexia, fever, weight loss, weight gain, vision loss, decreased hearing, hoarseness, chest pain, syncope, dyspnea on exertion, peripheral edema, prolonged cough, headaches, hemoptysis, abdominal pain, melena, hematochezia, severe indigestion/heartburn, hematuria, incontinence, genital sores, muscle weakness, suspicious skin lesions, transient blindness, difficulty walking, depression, unusual weight change, abnormal bleeding, enlarged  lymph nodes, angioedema, and breast masses.    Physical Exam  General:  Well-developed,well-nourished,in no acute distress; alert,appropriate and cooperative throughout examination Head:  Normocephalic and atraumatic without obvious abnormalities. No apparent alopecia or balding. Eyes:  pupils equal and pupils round.   Ears:  R ear normal and L ear normal.   Nose:  mucosal erythema and mucosal edema.   Mouth:  pharyngeal erythema, pharyngeal exudate, posterior lymphoid hypertrophy, and postnasal drip.   Neck:  No deformities, masses, or tenderness noted. Lungs:  Normal respiratory effort, chest expands symmetrically. Lungs are clear to auscultation, no crackles or wheezes. Heart:  Normal rate and regular rhythm. 60'Sgrade  3/6 systolic murmur.  ( WITH THRILL) Abdomen:  Bowel sounds positive,abdomen soft and non-tender without masses, organomegaly or hernias noted. Genitalia:  circumcised.   Msk:  No deformity or scoliosis noted of thoracic or lumbar spine.   Pulses:  R and L carotid,radial,femoral,dorsalis pedis and posterior tibial pulses are full and equal bilaterally Extremities:  No clubbing, cyanosis, edema, or deformity noted with normal full range of motion of all joints.     Impression & Recommendations:  Problem # 1:  HYPERTENSION (ICD-401.9) NEW WORSENING MURMUR ( AORTIC STENOSIS) His updated medication list for this problem includes:    Bystolic 5 Mg Tabs (Nebivolol hcl) .Marland Kitchen... 1 once daily  BP today: 120/72 Prior BP: 136/80 (10/16/2009)  Prior 10 Yr  Risk Heart Disease: Not enough information (01/17/2009)  Labs Reviewed: K+: 4.3 (10/16/2009) Creat: : 0.9 (10/16/2009)   Chol: 266 (10/16/2009)   HDL: 59.50 (10/16/2009)   LDL: DEL (09/10/2008)   TG: 103.0 (10/16/2009)  Problem # 2:  SYMPTOM, MEMORY LOSS (ICD-780.93)  obs ON ARICEPT  Problem # 3:  BENIGN PROSTATIC HYPERTROPHY, WITH OBSTRUCTION (ICD-600.01)  His updated medication list for this problem includes:     Finasteride 5 Mg Tabs (Finasteride) .Marland Kitchen... Take one by mouth daily  PSA: 0.33 (09/10/2008)     Problem # 4:  HYPERLIPIDEMIA (ICD-272.4)  Labs Reviewed: SGOT: 24 (10/16/2009)   SGPT: 14 (10/16/2009)  Lipid Goals: Chol Goal: 200 (03/01/2008)   HDL Goal: 40 (03/01/2008)   LDL Goal: 130 (03/01/2008)   TG Goal: 150 (03/01/2008)  Prior 10 Yr Risk Heart Disease: Not enough information (01/17/2009)   HDL:59.50 (10/16/2009), 61.70 (04/23/2009)  LDL:DEL (09/10/2008), DEL (05/29/2008)  Chol:266 (10/16/2009), 238 (04/23/2009)  Trig:103.0 (10/16/2009), 98.0 (01/01/2009)  Problem # 5:  MURMUR (ICD-785.2)  SET UPO AN ECHO  FOR CHANGEING MURMUR WTH A THRILL EKG obtained (see interpretation); Will refer to cardiology for evaluation of this murmur.   Orders: Echo Referral (Echo)  Complete Medication List: 1)  Gingo Biloba  .... Once daily 2)  Saw Palmetto  .... Once daily 3)  Calcium 600/vitamin D 600-200 Mg-unit Tabs (Calcium carbonate-vitamin d) .... Once daily 4)  Bystolic 5 Mg Tabs (Nebivolol hcl) .Marland Kitchen.. 1 once daily 5)  Red Yeast Rice 600 Mg Tabs (Red yeast rice extract) .... One by mouth two times a day to tid 6)  Phosphatidylserine  7)  Fish Oil Concentrate 1000 Mg Caps (Omega-3 fatty acids) .... Two by mouth two times a day 8)  Finasteride 5 Mg Tabs (Finasteride) .... Take one by mouth daily 9)  Aricept 5 Mg Tabs (Donepezil hcl) .... One by mouth daily 10)  Cerefolin Nac 6-2-600 Mg Tabs (Methylfol-methylcob-acetylcyst) .Marland Kitchen.. 1 once daily  Hypertension Assessment/Plan:      The patient's hypertensive risk group is category B: At least one risk factor (excluding diabetes) with no target organ damage.  Today's blood pressure is 120/72.  His blood pressure goal is < 140/90.  Patient Instructions: 1)  Please schedule a follow-up appointment in 3 months.

## 2010-11-20 NOTE — Assessment & Plan Note (Signed)
Summary: cpx//ccm/pt rescd from bump//ccm   Vital Signs:  Patient profile:   77 year old male Height:      67 inches Weight:      193 pounds BMI:     30.34 Temp:     98.2 degrees F oral Pulse rate:   64 / minute Resp:     14 per minute BP sitting:   130 / 80  (left arm)  Vitals Entered By: Estill Eddy, LPN (October 27, 2010 2:01 PM) CC: annual visit for disease management-- no colonoscopy, Hypertension Management Is Patient Diabetic? No   Primary Care Provider:  Stacie Glaze MD  CC:  annual visit for disease management-- no colonoscopy and Hypertension Management.  History of Present Illness: The pt was asked about all immunizations, health maint. services that are appropriate to their age and was given guidance on diet exercize  and weight management Review of the alleries and constant blowing of nose Review of the lipids and discussion of lipid medications in light of all conditions HTN stable The pt has increased urinary frequency  and his wife worries about the bystollics efffects on prostate..( yet feeds him butter!) he has been off the niacin.  Hypertension History:      He denies headache, chest pain, palpitations, dyspnea with exertion, orthopnea, PND, peripheral edema, visual symptoms, neurologic problems, syncope, and side effects from treatment.        Positive major cardiovascular risk factors include male age 78 years old or older, hyperlipidemia, and hypertension.  Negative major cardiovascular risk factors include non-tobacco-user status.     Preventive Screening-Counseling & Management  Alcohol-Tobacco     Smoking Status: quit > 6 months     Passive Smoke Exposure: yes     Tobacco Counseling: to remain off tobacco products     Passive Smoke Counseling: to avoid passive smoke exposure  Problems Prior to Update: 1)  Allergic Rhinitis Cause Unspecified  (ICD-477.9) 2)  Murmur  (ICD-785.2) 3)  Benign Prostatic Hypertrophy, With Obstruction   (ICD-600.01) 4)  Physical Examination  (ICD-V70.0) 5)  Symptom, Memory Loss  (ICD-780.93) 6)  Hypertension  (ICD-401.9) 7)  Hyperlipidemia  (ICD-272.4) 8)  Advef, Drug/medicinal/biological Subst Nos  (ICD-995.20)  Current Problems (verified): 1)  Murmur  (ICD-785.2) 2)  Benign Prostatic Hypertrophy, With Obstruction  (ICD-600.01) 3)  Physical Examination  (ICD-V70.0) 4)  Symptom, Memory Loss  (ICD-780.93) 5)  Hypertension  (ICD-401.9) 6)  Hyperlipidemia  (ICD-272.4) 7)  Advef, Drug/medicinal/biological Subst Nos  (ICD-995.20)  Medications Prior to Update: 1)  Gingo Biloba .... Once Daily 2)  Saw Palmetto .... Once Daily 3)  Calcium 600/vitamin D 600-200 Mg-Unit  Tabs (Calcium Carbonate-Vitamin D) .... Once Daily 4)  Bystolic 5 Mg Tabs (Nebivolol Hcl) .Marland Kitchen.. 1 Once Daily 5)  Phosphatidylserine 6)  Fish Oil Concentrate 1000 Mg Caps (Omega-3 Fatty Acids) .... Two By Mouth Two Times A Day 7)  Aricept 10 Mg Tabs (Donepezil Hcl) .Marland Kitchen.. 1 Once Daily 8)  Cerefolin Nac 6-2-600 Mg Tabs (Methylfol-Methylcob-Acetylcyst) .Marland Kitchen.. 1 Once Daily 9)  Niacin Flush Free 500 Mg Caps (Inositol Niacinate) .... One By Mouth Daily With Low Fat Snak Stop It He Complains of Itching  Current Medications (verified): 1)  Gingo Biloba .... Once Daily 2)  Saw Palmetto .... Once Daily 3)  Calcium 600/vitamin D 600-200 Mg-Unit  Tabs (Calcium Carbonate-Vitamin D) .... Once Daily 4)  Bystolic 5 Mg Tabs (Nebivolol Hcl) .Marland Kitchen.. 1 Once Daily 5)  Phosphatidylserine 6)  Fish Oil  Double Strength 1200 Mg Caps (Omega-3 Fatty Acids) .... 3 Once Daily 7)  Aricept 10 Mg Tabs (Donepezil Hcl) .Marland Kitchen.. 1 Once Daily 8)  B-12 Dots 500 Mcg Subl (Cyanocobalamin) .... One By Mouth Daily Rather That The Cerefolin 9)  Niacin Flush Free 500 Mg Caps (Inositol Niacinate) .... One By Mouth Daily With Low Fat Snak 10)  Allegra Allergy 180 Mg Tabs (Fexofenadine Hcl) .... One Po  Daily  Allergies (verified): 1)  Amoxicillin (Amoxicillin) 2)  *  Statin  Past History:  Family History: Last updated: 05/31/2007 father passed at 70 mother  died at 29 brother with memory problems  Social History: Last updated: 05/31/2007 Retired Married Former Smoker  Risk Factors: Smoking Status: quit > 6 months (10/27/2010) Passive Smoke Exposure: yes (10/27/2010)  Past medical, surgical, family and social histories (including risk factors) reviewed, and no changes noted (except as noted below).  Past Medical History: Reviewed history from 09/17/2008 and no changes required. Hyperlipidemia Hypertension memory loss  Past Surgical History: Reviewed history from 05/31/2007 and no changes required. Appendectomy  Family History: Reviewed history from 05/31/2007 and no changes required. father passed at 33 mother  died at 61 brother with memory problems  Social History: Reviewed history from 05/31/2007 and no changes required. Retired Married Former Smoker  Review of Systems  The patient denies anorexia, fever, weight loss, weight gain, vision loss, decreased hearing, hoarseness, chest pain, syncope, dyspnea on exertion, peripheral edema, prolonged cough, headaches, hemoptysis, abdominal pain, melena, hematochezia, severe indigestion/heartburn, hematuria, incontinence, genital sores, muscle weakness, suspicious skin lesions, transient blindness, difficulty walking, depression, unusual weight change, abnormal bleeding, enlarged lymph nodes, angioedema, breast masses, and testicular masses.    Physical Exam  General:  Well-developed,well-nourished,in no acute distress; alert,appropriate and cooperative throughout examination Head:  Normocephalic and atraumatic without obvious abnormalities. No apparent alopecia or balding. Ears:  R ear normal and L ear normal.   Nose:  mucosal erythema and mucosal edema.   Mouth:  pharyngeal erythema, pharyngeal exudate, posterior lymphoid hypertrophy, and postnasal drip.   Neck:  No deformities,  masses, or tenderness noted. Lungs:  Normal respiratory effort, chest expands symmetrically. Lungs are clear to auscultation, no crackles or wheezes. Heart:  Normal rate and regular rhythm. 60'Sgrade  3/6 systolic murmur.  ( WITH THRILL) Abdomen:  Bowel sounds positive,abdomen soft and non-tender without masses, organomegaly or hernias noted. Psych:  subdued, easily distracted, poor concentration, and memory impairment.     Impression & Recommendations:  Problem # 1:  HYPERTENSION (ICD-401.9)  His updated medication list for this problem includes:    Bystolic 5 Mg Tabs (Nebivolol hcl) .Marland Kitchen... 1 once daily  BP today: 130/80 Prior BP: 120/78 (07/15/2010)  Prior 10 Yr Risk Heart Disease: Not enough information (01/17/2009)  Labs Reviewed: K+: 4.7 (10/14/2010) Creat: : 0.9 (10/14/2010)   Chol: 289 (10/14/2010)   HDL: 56.10 (10/14/2010)   LDL: DEL (09/10/2008)   TG: 105.0 (10/14/2010)  Problem # 2:  HYPERLIPIDEMIA (ICD-272.4) esument eh niacin flush free and  Problem # 3:  ALLERGIC RHINITIS CAUSE UNSPECIFIED (ICD-477.9)  His updated medication list for this problem includes:    Allegra Allergy 180 Mg Tabs (Fexofenadine hcl) ..... One po  daily  Discussed use of allergy medications and environmental measures.   Problem # 4:  PHYSICAL EXAMINATION (ICD-V70.0) The pt was asked about all immunizations, health maint. services that are appropriate to their age and was given guidance on diet exercize  and weight management  Td Booster: Td (09/17/2008)  Flu Vax: Fluvax 3+ (07/15/2010)   Pneumovax: Pneumovax (Medicare) (12/01/2007) Chol: 289 (10/14/2010)   HDL: 56.10 (10/14/2010)   LDL: DEL (09/10/2008)   TG: 105.0 (10/14/2010) TSH: 2.25 (10/14/2010)   PSA: 0.29 (10/14/2010)  Discussed using sunscreen, use of alcohol, drug use, self testicular exam, routine dental care, routine eye care, routine physical exam, seat belts, multiple vitamins, osteoporosis prevention, adequate calcium intake in  diet, and recommendations for immunizations.  Discussed exercise and checking cholesterol.  Discussed gun safety, safe sex, and contraception. Also recommend checking PSA.  Problem # 5:  SYMPTOM, MEMORY LOSS (ICD-780.93) Assessment: Deteriorated  Complete Medication List: 1)  Gingo Biloba  .... Once daily 2)  Saw Palmetto  .... Once daily 3)  Calcium 600/vitamin D 600-200 Mg-unit Tabs (Calcium carbonate-vitamin d) .... Once daily 4)  Bystolic 5 Mg Tabs (Nebivolol hcl) .Marland Kitchen.. 1 once daily 5)  Phosphatidylserine  6)  Fish Oil Double Strength 1200 Mg Caps (Omega-3 fatty acids) .... 3 once daily 7)  Aricept 10 Mg Tabs (Donepezil hcl) .Marland Kitchen.. 1 once daily 8)  B-12 Dots 500 Mcg Subl (Cyanocobalamin) .... One by mouth daily rather that the cerefolin 9)  Niacin Flush Free 500 Mg Caps (Inositol niacinate) .... One by mouth daily with low fat snak 10)  Allegra Allergy 180 Mg Tabs (Fexofenadine hcl) .... One po  daily  Hypertension Assessment/Plan:      The patient's hypertensive risk group is category B: At least one risk factor (excluding diabetes) with no target organ damage.  Today's blood pressure is 130/80.  His blood pressure goal is < 140/90.  Patient Instructions: 1)  Please schedule a follow-up appointment in 3 months.   Orders Added: 1)  Est. Patient 65& > [[id number]] 2)  Est. Patient Level III [[id number]]

## 2010-12-15 ENCOUNTER — Telehealth: Payer: Self-pay | Admitting: *Deleted

## 2010-12-15 NOTE — Telephone Encounter (Signed)
DR Lovell Sheehan IS AWARE AND TOLD TRIAGE WHAT TO TELL PT

## 2010-12-15 NOTE — Telephone Encounter (Signed)
Pt took a double dose of Bystolic and Aricept today.  Is feeling fine.  Per. Dr. Lovell Sheehan ........asl long as he is not dizzy or feeling faint, just watch him.

## 2011-01-21 ENCOUNTER — Encounter: Payer: Self-pay | Admitting: Internal Medicine

## 2011-01-27 ENCOUNTER — Other Ambulatory Visit: Payer: Self-pay | Admitting: Internal Medicine

## 2011-01-27 ENCOUNTER — Ambulatory Visit: Payer: Self-pay | Admitting: Internal Medicine

## 2011-02-02 ENCOUNTER — Ambulatory Visit (INDEPENDENT_AMBULATORY_CARE_PROVIDER_SITE_OTHER): Payer: Medicare (Managed Care) | Admitting: Internal Medicine

## 2011-02-02 ENCOUNTER — Encounter: Payer: Self-pay | Admitting: Internal Medicine

## 2011-02-02 DIAGNOSIS — I10 Essential (primary) hypertension: Secondary | ICD-10-CM

## 2011-02-02 DIAGNOSIS — E785 Hyperlipidemia, unspecified: Secondary | ICD-10-CM

## 2011-02-02 DIAGNOSIS — R413 Other amnesia: Secondary | ICD-10-CM

## 2011-02-02 MED ORDER — DONEPEZIL HCL 23 MG PO TABS: 23.0000 mg | ORAL_TABLET | Freq: Every day | ORAL | Status: DC

## 2011-02-02 NOTE — Assessment & Plan Note (Signed)
The patient is on bystollic which is a mixed half and beta blocker and this controlled his blood pressure excellently

## 2011-02-02 NOTE — Assessment & Plan Note (Signed)
Patient has been intolerant of statins in the past at age and risk factors no medication indicated at this time other than supplements such as omega-3 fish or

## 2011-02-02 NOTE — Progress Notes (Signed)
  Subjective:    Patient ID: Lucas Khan, male    DOB: 04/21/1931, 77 y.o.   MRN: [id number]  HPI he is a pleasant 77 year old male with unfortunate progressive dementia of the Alzheimer's type he is not oriented to month or year but does recognize that in this situation his wife states that he has been relatively stable she tries to exercise his brain with translating patch and English. He has no behavioral issues his weight is stable he well he dresses well and has good hygiene largely due to her meticulous care he denies any chest pain shortness of breath PND orthopnea stable blood pressure and hyperlipidemia    Review of Systems  Constitutional: Negative for fever and fatigue.  HENT: Negative for hearing loss, congestion, neck pain and postnasal drip.   Eyes: Negative for discharge, redness and visual disturbance.  Respiratory: Negative for cough, shortness of breath and wheezing.   Cardiovascular: Negative for leg swelling.  Gastrointestinal: Negative for abdominal pain, constipation and abdominal distention.  Genitourinary: Negative for urgency and frequency.  Musculoskeletal: Negative for joint swelling and arthralgias.  Skin: Negative for color change and rash.  Neurological: Negative for weakness and light-headedness.  Hematological: Negative for adenopathy.  Psychiatric/Behavioral: Negative for behavioral problems.       Past Medical History  Diagnosis Date  . Hyperlipidemia   . Hypertension   . Memory loss    Past Surgical History  Procedure Date  . Appendectomy     reports that he quit smoking about 12 months ago. He does not have any smokeless tobacco history on file. He reports that he does not drink alcohol. His drug history not on file. family history includes Memory loss in an unspecified family member. Allergies  Allergen Reactions  . Amoxicillin     REACTION: unspecified  . Statins     REACTION: unspecified    Objective:   Physical Exam    Constitutional: He appears well-developed and well-nourished.  HENT:  Head: Normocephalic and atraumatic.  Eyes: Conjunctivae are normal. Pupils are equal, round, and reactive to light.  Neck: Normal range of motion. Neck supple.  Cardiovascular: Normal rate and regular rhythm.   Pulmonary/Chest: Effort normal and breath sounds normal.  Abdominal: Soft. Bowel sounds are normal.  Neurological:       He is not oriented to date or year. He is oriented to place and situation          Assessment & Plan:

## 2011-02-02 NOTE — Assessment & Plan Note (Addendum)
Memory issues are pronounced He waxes and wain we'll try to increase his Aricept to the 23 mg time released at bedtime samples were given to see if he will tolerate this medication the side effects were explained to his wife and we will follow him back in 2 months time

## 2011-04-28 ENCOUNTER — Other Ambulatory Visit (INDEPENDENT_AMBULATORY_CARE_PROVIDER_SITE_OTHER): Payer: No Typology Code available for payment source

## 2011-04-28 DIAGNOSIS — E785 Hyperlipidemia, unspecified: Secondary | ICD-10-CM

## 2011-04-28 DIAGNOSIS — T887XXA Unspecified adverse effect of drug or medicament, initial encounter: Secondary | ICD-10-CM

## 2011-04-28 LAB — LIPID PANEL
Cholesterol: 272 mg/dL — ABNORMAL HIGH (ref 0–200)
Triglycerides: 82 mg/dL (ref 0.0–149.0)

## 2011-04-28 LAB — BASIC METABOLIC PANEL
BUN: 17 mg/dL (ref 6–23)
CO2: 28 mEq/L (ref 19–32)
Chloride: 108 mEq/L (ref 96–112)
GFR: 90.11 mL/min (ref >60.00)
Glucose, Bld: 101 mg/dL — ABNORMAL HIGH (ref 70–99)
Potassium: 4.4 mEq/L (ref 3.5–5.1)
Sodium: 141 mEq/L (ref 135–145)

## 2011-04-28 LAB — HEPATIC FUNCTION PANEL
ALT: 13 U/L (ref 0–53)
Bilirubin, Direct: 0 mg/dL (ref 0.0–0.3)
Total Bilirubin: 0.5 mg/dL (ref 0.3–1.2)

## 2011-05-04 ENCOUNTER — Ambulatory Visit (INDEPENDENT_AMBULATORY_CARE_PROVIDER_SITE_OTHER): Payer: No Typology Code available for payment source | Admitting: Internal Medicine

## 2011-05-04 VITALS — BP 120/70 | HR 60 | Temp 98.2°F | Resp 14 | Ht 67.0 in | Wt 185.0 lb

## 2011-05-04 DIAGNOSIS — R413 Other amnesia: Secondary | ICD-10-CM

## 2011-05-04 DIAGNOSIS — E785 Hyperlipidemia, unspecified: Secondary | ICD-10-CM

## 2011-05-04 DIAGNOSIS — I10 Essential (primary) hypertension: Secondary | ICD-10-CM

## 2011-05-04 MED ORDER — INOSITOL NIACINATE 500 MG PO TABS: 1.0000 | ORAL_TABLET | Freq: Every evening | ORAL | Status: DC

## 2011-05-04 MED ORDER — OMEGA-3 FATTY ACIDS 1000 MG PO CAPS: 3.0000 g | ORAL_CAPSULE | Freq: Every day | ORAL | Status: DC

## 2011-05-04 NOTE — Progress Notes (Signed)
  Subjective:    Patient ID: Lucas Khan, male    DOB: July 09, 1931, 77 y.o.   MRN: [id number]  HPI Patient is a 77 year old white male is followed for mild-to-moderate dementia hypertension and hyperlipidemia.  He speaks 2 languages and if is able to translate between languages but his wife feels that his dementia has worsened.  He passes a clock test with the end he has some distinct first wife's interference.  I suspect that he does have a component of mild to moderate depression since he stopped his job and that his memory is somewhat affected by age hypertension and hyperlipidemia but I do not suspect this is Alzheimer's.  He is alert and oriented able to go to movies and enjoys them and able to translate literature   Review of Systems  Constitutional: Negative for fever and fatigue.  HENT: Negative for hearing loss, congestion, neck pain and postnasal drip.   Eyes: Negative for discharge, redness and visual disturbance.  Respiratory: Negative for cough, shortness of breath and wheezing.   Cardiovascular: Negative for leg swelling.  Gastrointestinal: Negative for abdominal pain, constipation and abdominal distention.  Genitourinary: Negative for urgency and frequency.  Musculoskeletal: Negative for joint swelling and arthralgias.  Skin: Negative for color change and rash.  Neurological: Negative for weakness and light-headedness.  Hematological: Negative for adenopathy.  Psychiatric/Behavioral: Negative for behavioral problems.   Past Medical History  Diagnosis Date  . Hyperlipidemia   . Hypertension   . Memory loss    Past Surgical History  Procedure Date  . Appendectomy     reports that he quit smoking about 16 months ago. He does not have any smokeless tobacco history on file. He reports that he does not drink alcohol. His drug history not on file. family history includes Memory loss in an unspecified family member. Allergies  Allergen Reactions  . Amoxicillin     REACTION: unspecified  . Statins     REACTION: unspecified       Objective:   Physical Exam  Nursing note and vitals reviewed. Constitutional: He appears well-developed and well-nourished.  HENT:  Head: Normocephalic and atraumatic.  Eyes: Conjunctivae are normal. Pupils are equal, round, and reactive to light.  Neck: Normal range of motion. Neck supple.  Cardiovascular: Normal rate and regular rhythm.   Pulmonary/Chest: Effort normal and breath sounds normal.  Abdominal: Soft. Bowel sounds are normal.  Psychiatric: He has a normal mood and affect. His behavior is normal. Judgment and thought content normal.          Assessment & Plan:  His blood pressure stable his current medications his wife is concerned about the use of beta blockers we reassured her that these are appropriate medications for him and that his low heart rate is well tolerated.  His memory loss appears to be stable he is still able to perform some significant functions at a higher learning.  I do not believe this represents Alzheimer's hyperlipidemia is stable off medications he cannot tolerate statins due to his advanced age and his intolerance of statins we are using visual as the only intervention for his cholesterol this time this was discussed with his wife.  We also encouraged her to keep taking a baby aspirin every day

## 2011-05-04 NOTE — Patient Instructions (Signed)
Gradually increased exercise

## 2011-05-16 ENCOUNTER — Other Ambulatory Visit: Payer: Self-pay | Admitting: Internal Medicine

## 2011-05-22 ENCOUNTER — Encounter: Payer: Self-pay | Admitting: Internal Medicine

## 2011-06-17 ENCOUNTER — Telehealth: Payer: Self-pay | Admitting: *Deleted

## 2011-06-17 MED ORDER — AMLODIPINE BESYLATE 5 MG PO TABS: 5.0000 mg | ORAL_TABLET | Freq: Every day | ORAL | Status: DC

## 2011-06-17 NOTE — Telephone Encounter (Signed)
o

## 2011-06-17 NOTE — Telephone Encounter (Signed)
Pt and wife walked concerned because they had bp taken and it wouldn't register due to being "too low"  .  bp was 110/60 but pulse was 30- dr Lovell Sheehan informed and he gave instructions to stop bystolic an d start amlodipine 5 mg- pt and wife informed and med sent to pharmacy

## 2011-08-03 ENCOUNTER — Ambulatory Visit (INDEPENDENT_AMBULATORY_CARE_PROVIDER_SITE_OTHER): Payer: No Typology Code available for payment source | Admitting: Internal Medicine

## 2011-08-03 VITALS — BP 140/60 | HR 72 | Temp 98.2°F | Resp 16 | Ht 67.0 in | Wt 182.0 lb

## 2011-08-03 DIAGNOSIS — I10 Essential (primary) hypertension: Secondary | ICD-10-CM

## 2011-08-03 DIAGNOSIS — Z23 Encounter for immunization: Secondary | ICD-10-CM

## 2011-08-03 DIAGNOSIS — Z0000 Encounter for general adult medical examination without abnormal findings: Secondary | ICD-10-CM

## 2011-08-03 NOTE — Patient Instructions (Signed)
Patient was instructed to continue all medications as prescribed. To stop at the checkout desk and schedule a followup appointment  

## 2011-10-05 ENCOUNTER — Other Ambulatory Visit: Payer: Self-pay | Admitting: Internal Medicine

## 2011-10-05 ENCOUNTER — Other Ambulatory Visit (INDEPENDENT_AMBULATORY_CARE_PROVIDER_SITE_OTHER): Payer: No Typology Code available for payment source

## 2011-10-05 DIAGNOSIS — Z79899 Other long term (current) drug therapy: Secondary | ICD-10-CM

## 2011-10-05 DIAGNOSIS — Z125 Encounter for screening for malignant neoplasm of prostate: Secondary | ICD-10-CM

## 2011-10-05 DIAGNOSIS — Z0000 Encounter for general adult medical examination without abnormal findings: Secondary | ICD-10-CM

## 2011-10-05 LAB — TSH: TSH: 1.49 u[IU]/mL (ref 0.35–5.50)

## 2011-10-05 LAB — CBC WITH DIFFERENTIAL/PLATELET
Basophils Absolute: 0.1 10*3/uL (ref 0.0–0.1)
Eosinophils Absolute: 0.4 10*3/uL (ref 0.0–0.7)
HCT: 38.6 % — ABNORMAL LOW (ref 39.0–52.0)
Lymphs Abs: 1.6 10*3/uL (ref 0.7–4.0)
MCHC: 33.4 g/dL (ref 30.0–36.0)
Monocytes Relative: 14.3 % — ABNORMAL HIGH (ref 3.0–12.0)
Platelets: 235 10*3/uL (ref 150.0–400.0)
RDW: 14.7 % — ABNORMAL HIGH (ref 11.5–14.6)

## 2011-10-05 LAB — BASIC METABOLIC PANEL
Calcium: 9.1 mg/dL (ref 8.4–10.5)
GFR: 74.91 mL/min (ref >60.00)
Sodium: 144 mEq/L (ref 135–145)

## 2011-10-05 LAB — LDL CHOLESTEROL, DIRECT: Direct LDL: 173.3 mg/dL

## 2011-10-05 LAB — HEPATIC FUNCTION PANEL
Albumin: 3.3 g/dL — ABNORMAL LOW (ref 3.5–5.2)
Alkaline Phosphatase: 65 U/L (ref 39–117)
Bilirubin, Direct: 0.1 mg/dL (ref 0.0–0.3)

## 2011-10-05 LAB — LIPID PANEL
HDL: 61.1 mg/dL (ref >39.00)
VLDL: 16.6 mg/dL (ref 0.0–40.0)

## 2011-10-05 LAB — PSA: PSA: 0.27 ng/mL (ref 0.10–4.00)

## 2011-10-30 ENCOUNTER — Encounter: Payer: No Typology Code available for payment source | Admitting: Internal Medicine

## 2011-11-16 ENCOUNTER — Ambulatory Visit (INDEPENDENT_AMBULATORY_CARE_PROVIDER_SITE_OTHER): Payer: Medicare HMO | Admitting: Internal Medicine

## 2011-11-16 DIAGNOSIS — Z0000 Encounter for general adult medical examination without abnormal findings: Secondary | ICD-10-CM

## 2011-11-16 NOTE — Patient Instructions (Signed)
The patient is instructed to continue all medications as prescribed. Schedule followup with check out clerk upon leaving the clinic  

## 2011-11-16 NOTE — Progress Notes (Signed)
Subjective:    Patient ID: Lucas Khan, male    DOB: Aug 02, 1931, 78 y.o.   MRN: [id number]  HPI CPX visit   Review of Systems  Constitutional: Negative for fever and fatigue.  HENT: Negative for hearing loss, congestion, neck pain and postnasal drip.   Eyes: Negative for discharge, redness and visual disturbance.  Respiratory: Negative for cough, shortness of breath and wheezing.   Cardiovascular: Negative for leg swelling.  Gastrointestinal: Negative for abdominal pain, constipation and abdominal distention.  Genitourinary: Negative for urgency and frequency.  Musculoskeletal: Negative for joint swelling and arthralgias.  Skin: Negative for color change and rash.  Neurological: Negative for weakness and light-headedness.  Hematological: Negative for adenopathy.  Psychiatric/Behavioral: Negative for behavioral problems.   Past Medical History  Diagnosis Date  . Hyperlipidemia   . Hypertension   . Memory loss     History   Social History  . Marital Status: Married    Spouse Name: N/A    Number of Children: N/A  . Years of Education: N/A   Occupational History  . retired    Social History Main Topics  . Smoking status: Former Smoker    Quit date: 01/17/2010  . Smokeless tobacco: Not on file  . Alcohol Use: No  . Drug Use: Not on file  . Sexually Active: Not Currently   Other Topics Concern  . Not on file   Social History Narrative  . No narrative on file    Past Surgical History  Procedure Date  . Appendectomy     Family History  Problem Relation Age of Onset  . Memory loss      Allergies  Allergen Reactions  . Amoxicillin     REACTION: unspecified  . Statins     REACTION: unspecified    Current Outpatient Prescriptions on File Prior to Visit  Medication Sig Dispense Refill  . amLODipine (NORVASC) 5 MG tablet Take 1 tablet (5 mg total) by mouth daily.  30 tablet  6  . calcium carbonate (OS-CAL) 600 MG TABS Take 600 mg by mouth daily.         . Cyanocobalamin (B-12 DOTS) 500 MCG SUBL Place under the tongue. 2-3 times a day      . donepezil (ARICEPT) 10 MG tablet TAKE ONE TABLET BY MOUTH EVERY DAY  30 tablet  6  . fish oil-omega-3 fatty acids 1000 MG capsule Take 3 capsules (3 g total) by mouth daily.      . Ginkgo Biloba 40 MG TABS Take by mouth daily.        . Inositol Niacinate (NO FLUSH NIACIN) 500 MG TABS Take 1 tablet (500 mg total) by mouth Nightly.  30 tablet    . PHOSPHATIDYLSERINE PO Take by mouth daily.        . saw palmetto 500 MG capsule Take 500 mg by mouth daily.          BP 136/80  Pulse 70  Temp 98.3 F (36.8 C)  Resp 16  Ht 5\' 7"  (1.702 m)  Wt 181 lb (82.101 kg)  BMI 28.35 kg/m2       Objective:   Physical Exam  Nursing note and vitals reviewed. Constitutional: He appears well-developed and well-nourished.  HENT:  Head: Normocephalic and atraumatic.  Eyes: Conjunctivae are normal. Pupils are equal, round, and reactive to light.  Neck: Normal range of motion. Neck supple.  Cardiovascular: Normal rate and regular rhythm.   Pulmonary/Chest: Effort normal and breath sounds normal.  Abdominal: Soft. Bowel sounds are normal.          Assessment & Plan:   Patient presents for yearly preventative medicine examination.   all immunizations and health maintenance protocols were reviewed with the patient and they are up to date with these protocols.   screening laboratory values were reviewed with the patient including screening of hyperlipidemia PSA renal function and hepatic function.   There medications past medical history social history problem list and allergies were reviewed in detail.   Goals were established with regard to weight loss exercise diet in compliance with medications

## 2011-11-25 ENCOUNTER — Encounter: Payer: Self-pay | Admitting: Internal Medicine

## 2012-01-07 ENCOUNTER — Other Ambulatory Visit: Payer: Self-pay | Admitting: Internal Medicine

## 2012-02-03 ENCOUNTER — Other Ambulatory Visit: Payer: Self-pay | Admitting: Internal Medicine

## 2012-03-15 ENCOUNTER — Ambulatory Visit: Payer: Medicare HMO | Admitting: Internal Medicine

## 2012-03-16 ENCOUNTER — Encounter: Payer: Self-pay | Admitting: Internal Medicine

## 2012-03-16 ENCOUNTER — Ambulatory Visit: Payer: Medicare HMO | Admitting: Internal Medicine

## 2012-03-16 ENCOUNTER — Encounter: Payer: Self-pay | Admitting: Nurse Practitioner

## 2012-03-16 ENCOUNTER — Ambulatory Visit (INDEPENDENT_AMBULATORY_CARE_PROVIDER_SITE_OTHER): Payer: Medicare HMO | Admitting: Internal Medicine

## 2012-03-16 ENCOUNTER — Ambulatory Visit (INDEPENDENT_AMBULATORY_CARE_PROVIDER_SITE_OTHER): Payer: Medicare HMO | Admitting: Nurse Practitioner

## 2012-03-16 VITALS — BP 162/64 | HR 56 | Ht 67.0 in | Wt 175.4 lb

## 2012-03-16 VITALS — BP 122/64 | HR 40 | Temp 98.2°F | Resp 16 | Ht 70.0 in | Wt 174.0 lb

## 2012-03-16 DIAGNOSIS — R011 Cardiac murmur, unspecified: Secondary | ICD-10-CM

## 2012-03-16 DIAGNOSIS — R001 Bradycardia, unspecified: Secondary | ICD-10-CM

## 2012-03-16 DIAGNOSIS — I498 Other specified cardiac arrhythmias: Secondary | ICD-10-CM

## 2012-03-16 DIAGNOSIS — I441 Atrioventricular block, second degree: Secondary | ICD-10-CM

## 2012-03-16 DIAGNOSIS — R413 Other amnesia: Secondary | ICD-10-CM

## 2012-03-16 LAB — BASIC METABOLIC PANEL
BUN: 19 mg/dL (ref 6–23)
Chloride: 104 mEq/L (ref 96–112)
Creatinine, Ser: 1 mg/dL (ref 0.4–1.5)
GFR: 77.45 mL/min (ref >60.00)
Potassium: 4.5 mEq/L (ref 3.5–5.1)

## 2012-03-16 LAB — TSH: TSH: 1.26 u[IU]/mL (ref 0.35–5.50)

## 2012-03-16 LAB — CBC WITH DIFFERENTIAL/PLATELET
Basophils Relative: 0.8 % (ref 0.0–3.0)
Eosinophils Relative: 5.7 % — ABNORMAL HIGH (ref 0.0–5.0)
MCV: 81.2 fl (ref 78.0–100.0)
Monocytes Relative: 18.3 % — ABNORMAL HIGH (ref 3.0–12.0)
Neutrophils Relative %: 52.9 % (ref 43.0–77.0)
Platelets: 273 10*3/uL (ref 150.0–400.0)
RBC: 4.52 Mil/uL (ref 4.22–5.81)
WBC: 7 10*3/uL (ref 4.5–10.5)

## 2012-03-16 NOTE — Assessment & Plan Note (Addendum)
The patient was subsequently seen along with Dr. Johney Frame. He has evidence of sinus node dysfunction. He is currently not symptomatic. Pacemaker implant however is recommended due to the risk of syncope and even sudden death. This was discussed at length with the patient and his wife. Lucas Khan does not wish to pursue pacemaker implant at this time. Also refused to have 2 d echo. We have advised him that he should not be driving. He verbalizes understanding of the issue at hand, and seems to understand the seriousness, but still does not wish to proceed. We will let Dr. Lovell Sheehan know. We will be available should Lucas Khan change his mind and the office number and my card is given to him. Patient is agreeable to this plan and will call if any problems develop in the interim.   Dr. Lovell Sheehan called me today on 04/13/12. He has had a lengthy conversation with the family and patient. The wife has stopped the patient's Aricept for the last 2 weeks after reading on the Internet that it may cause heart block. A repeat EKG today with Dr. Lovell Sheehan is reported to be unchanged. They are now willing to proceed on with PTVP implant. Will let Dr. Johney Frame and his nurse Tresa Endo be aware.      Addendum: I have seen, examined the patient, and reviewed the above assessment and plan.  Changes are made above where necessary.  We ambulated the patient in the office today without improvement in AV conduction.  The patient has mobitz II second degreee AV block.  I am concerned that he is at high risk for syncope or death should his AV block advance.  I had a long and frank discussion with the patient and his wife about this.  I would therefore recommend pacemaker implantation at this time.  Risks, benefits, alternatives to pacemaker implantation were discussed in detail with the patient today.  Unfortunately, the patient is very clear in his decision to decline PPM at this time.  He understands risks involved in not proceeding with  PPM.  I have instructed him to not drive until he has a pacemaker in place.  He and his wife express understanding and willingness to comply.  They will contact my office if they decide to proceed with PPM in the future.  Co Sign: Hillis Range, MD 03/17/2012 3:35 PM

## 2012-03-16 NOTE — Progress Notes (Signed)
Lucas Khan Date of Birth: [birth year] Medical Record #[id number]  History of Present Illness: Lucas Khan is seen today for a new patient visit. He is subsequently seen with Dr. Johney Frame. He is a pleasant 78 year old male with no known cardiac issues. Has HTN and reports having a murmur since birth. Does have some issues with his memory. Was seen earlier today at Dr. Lovell Sheehan for a routine visit, noted to be bradycardic and was referred here for further evaluation. He is on Norvasc and Aricept. No rate slowing medicines.   He comes in today. He is here with his wife. He says he is feeling good. Not lightheaded or dizzy. Has never passed out. Has no chest pain and not short of breath. Remains active but his wife notes that he is slowing down. He picked up 7 bags of mulch yesterday from the [employer]. He is still driving. Says he has had a murmur since birth. Never had an echo. No known CAD.   Current Outpatient Prescriptions on File Prior to Visit  Medication Sig Dispense Refill  . DISCONTD: amLODipine (NORVASC) 5 MG tablet TAKE ONE TABLET BY MOUTH EVERY DAY  30 tablet  6  . DISCONTD: calcium carbonate (OS-CAL) 600 MG TABS Take 600 mg by mouth daily.        Marland Kitchen DISCONTD: Cyanocobalamin (B-12 DOTS) 500 MCG SUBL Place under the tongue. 2-3 times a day      . DISCONTD: donepezil (ARICEPT) 10 MG tablet TAKE ONE TABLET BY MOUTH EVERY DAY  90 tablet  3  . DISCONTD: fish oil-omega-3 fatty acids 1000 MG capsule Take 3 capsules (3 g total) by mouth daily.      Marland Kitchen DISCONTD: Ginkgo Biloba 40 MG TABS Take by mouth daily.        Marland Kitchen DISCONTD: Inositol Niacinate (NO FLUSH NIACIN) 500 MG TABS Take 1 tablet (500 mg total) by mouth Nightly.  30 tablet    . DISCONTD: PHOSPHATIDYLSERINE PO Take by mouth daily.        Marland Kitchen DISCONTD: saw palmetto 500 MG capsule Take 500 mg by mouth daily.          Allergies  Allergen Reactions  . Amoxicillin     REACTION: unspecified  . Statins     REACTION: unspecified     Past Medical History  Diagnosis Date  . Hyperlipidemia   . Hypertension   . Memory loss   . Bradycardia   . Murmur     Past Surgical History  Procedure Date  . Appendectomy     History  Smoking status  . Former Smoker  . Quit date: 01/17/2010  Smokeless tobacco  . Not on file    History  Alcohol Use No    Family History  Problem Relation Age of Onset  . Memory loss      Review of Systems: The review of systems is per the HPI.  All other systems were reviewed and are negative.  Physical Exam: BP 162/64  Pulse 56  Ht 5\' 7"  (1.702 m)  Wt 175 lb 6.4 oz (79.561 kg)  BMI 27.47 kg/m2 Patient is very pleasant and in no acute distress. Skin is warm and dry. Color is normal.  HEENT is unremarkable. Normocephalic/atraumatic. PERRL. Sclera are nonicteric. Neck is supple. No masses. No JVD. Lungs are clear. Cardiac exam shows a regular rhythm. Rate is slow on exam. He has a soft outflow murmur noted. Abdomen is soft. Extremities are without edema. Gait and ROM  are intact. No gross neurologic deficits noted.  LABORATORY DATA:   His EKG from Dr. Lovell Sheehan shows marked sinus bradycardia, 1st degree AV block and sinus arrest. We repeated his EKG here and got sinus bradycardia, rate of 51 with 1st degree AV block. He ambulated in the office with the pulse ox. Heart rate briefly reached 60. Repeat EKG after walking showed sinus bradycardia, rate of 51, 1st degree AV block and sinus arrest again.   Lab Results  Component Value Date   WBC 7.0 03/16/2012   HGB 11.7* 03/16/2012   HCT 36.7* 03/16/2012   PLT 273.0 03/16/2012   GLUCOSE 94 03/16/2012   CHOL 255* 10/05/2011   TRIG 83.0 10/05/2011   HDL 61.10 10/05/2011   LDLDIRECT 173.3 10/05/2011   ALT 11 10/05/2011   AST 15 10/05/2011   NA 139 03/16/2012   K 4.5 03/16/2012   CL 104 03/16/2012   CREATININE 1.0 03/16/2012   BUN 19 03/16/2012   CO2 29 03/16/2012   TSH 1.26 03/16/2012   PSA 0.27 10/05/2011     Assessment / Plan:

## 2012-03-16 NOTE — Patient Instructions (Signed)
Go to the [address]  office of [facility] across from the hospital near the fire station  Go to the cardiology office and tell the receptionist that you are work in patient with a doctor of the day Dr. Marcell Barlow.

## 2012-03-16 NOTE — Patient Instructions (Addendum)
We have recommended that you stop driving  We are recommending a pacemaker to help with your heart rhythm.  We are also recommending an ultrasound of your heart  If you change your mind, please call and let Dr. Johney Frame know.  Call the [facility] office at [phone] if you have any questions, problems or concerns.

## 2012-03-16 NOTE — Progress Notes (Signed)
Subjective:    Patient ID: Lucas Khan, male    DOB: 12-25-1930, 78 y.o.   MRN: [id number]  HPI Patient is a 78 year old white male with a history of hypertension hyperlipidemia and memory loss.  He presents today for a routine office visit and as an incidental finding his pulse was 40 on physical examination there is frequent pauses up to 2-3 seconds he came that he may be a heart block and EKG has been ordered he is on amlodipine and his last potassium was 5.0 prior to that they were normal potassium he has not had any other history of electrolyte abnormality he has good renal function.  He states that he is completely asymptomatic and his wife states that he is not complaining of any dizziness orthostatic symptoms or shortness of breath with walking or exertion. He denies any chest pain, but this response is complicated by his memory disorder  His wife reports he cannot walk as fast as she can without SOB.   Review of Systems  Constitutional: Negative for fever and fatigue.  HENT: Negative for hearing loss, congestion, neck pain and postnasal drip.   Eyes: Negative for discharge, redness and visual disturbance.  Respiratory: Negative for cough, shortness of breath and wheezing.   Cardiovascular: Negative for leg swelling.  Gastrointestinal: Negative for abdominal pain, constipation and abdominal distention.  Genitourinary: Negative for urgency and frequency.  Musculoskeletal: Negative for joint swelling and arthralgias.  Skin: Negative for color change and rash.  Neurological: Negative for weakness and light-headedness.  Hematological: Negative for adenopathy.  Psychiatric/Behavioral: Negative for behavioral problems.       Past Medical History  Diagnosis Date  . Hyperlipidemia   . Hypertension   . Memory loss     History   Social History  . Marital Status: Married    Spouse Name: N/A    Number of Children: N/A  . Years of Education: N/A   Occupational History    . retired    Social History Main Topics  . Smoking status: Former Smoker    Quit date: 01/17/2010  . Smokeless tobacco: Not on file  . Alcohol Use: No  . Drug Use: Not on file  . Sexually Active: Not Currently   Other Topics Concern  . Not on file   Social History Narrative  . No narrative on file    Past Surgical History  Procedure Date  . Appendectomy     Family History  Problem Relation Age of Onset  . Memory loss      Allergies  Allergen Reactions  . Amoxicillin     REACTION: unspecified  . Statins     REACTION: unspecified    Current Outpatient Prescriptions on File Prior to Visit  Medication Sig Dispense Refill  . amLODipine (NORVASC) 5 MG tablet TAKE ONE TABLET BY MOUTH EVERY DAY  30 tablet  6  . calcium carbonate (OS-CAL) 600 MG TABS Take 600 mg by mouth daily.        . Cyanocobalamin (B-12 DOTS) 500 MCG SUBL Place under the tongue. 2-3 times a day      . donepezil (ARICEPT) 10 MG tablet TAKE ONE TABLET BY MOUTH EVERY DAY  90 tablet  3  . fish oil-omega-3 fatty acids 1000 MG capsule Take 3 capsules (3 g total) by mouth daily.      . Ginkgo Biloba 40 MG TABS Take by mouth daily.        . Inositol Niacinate (NO FLUSH NIACIN) 500  MG TABS Take 1 tablet (500 mg total) by mouth Nightly.  30 tablet    . PHOSPHATIDYLSERINE PO Take by mouth daily.        . saw palmetto 500 MG capsule Take 500 mg by mouth daily.          BP 122/64  Pulse 40  Temp 98.2 F (36.8 C)  Resp 16  Ht 5\' 10"  (1.778 m)  Wt 174 lb (78.926 kg)  BMI 24.97 kg/m2    Objective:   Physical Exam  Nursing note and vitals reviewed. Constitutional: He appears well-developed and well-nourished.  HENT:  Head: Normocephalic and atraumatic.  Eyes: Conjunctivae are normal. Pupils are equal, round, and reactive to light.  Neck: Normal range of motion. Neck supple.  Cardiovascular: Normal rate and regular rhythm.   Murmur heard.      Bradycardia with pauses  Pulmonary/Chest: Effort normal  and breath sounds normal.  Abdominal: Soft. Bowel sounds are normal.          Assessment & Plan:  78 year old male with long-standing hyperlipidemia and hypertension Recently we had discontinued statins due to intolerance of the medications. He has had hypertension that has been well-controlled with his amlodipine he is on Aricept and visual can go no flush niacin as his wife prefers a more homeopathic approach to medical problems.  His weight is stable he continues to exercise and walk and has experienced no chest pain but his EKG shows  Possible mobitz? I have discussed the case with Dr Marcell Barlow and he will kindly see him as a work in. He will do a brief exercise study to see if the pulse improved with exercise.

## 2012-03-17 NOTE — Progress Notes (Signed)
Addended by: Hillis Range on: 03/17/2012 03:38 PM   Modules accepted: Level of Service

## 2012-03-28 ENCOUNTER — Telehealth: Payer: Self-pay | Admitting: Nurse Practitioner

## 2012-03-28 DIAGNOSIS — R011 Cardiac murmur, unspecified: Secondary | ICD-10-CM

## 2012-03-28 NOTE — Telephone Encounter (Signed)
New Problem:    Patient's wife called in wanting to speak with Lawson Fiscal about her husbands ultrasound.  Please call back.

## 2012-03-28 NOTE — Telephone Encounter (Signed)
Patient's wife called ,she said that Dr. Johney Frame recommended for pt to have an Pacemaker implant due to pt having Mobitz II second degree AV block. Wife states that pt has symptoms of dementia so he is refusing the pacemaker, so she would like for pt to have th ultrasound of the heart recommended by Norma Fredrickson  first, then  will think about the pacemaker after getting the results.

## 2012-03-28 NOTE — Telephone Encounter (Signed)
2-D echo order per Norma Fredrickson NP order in patient and wife aware.

## 2012-03-31 ENCOUNTER — Ambulatory Visit (HOSPITAL_COMMUNITY): Payer: Medicare HMO | Attending: Cardiology | Admitting: Radiology

## 2012-03-31 DIAGNOSIS — I498 Other specified cardiac arrhythmias: Secondary | ICD-10-CM | POA: Insufficient documentation

## 2012-03-31 DIAGNOSIS — R011 Cardiac murmur, unspecified: Secondary | ICD-10-CM

## 2012-03-31 DIAGNOSIS — I359 Nonrheumatic aortic valve disorder, unspecified: Secondary | ICD-10-CM | POA: Insufficient documentation

## 2012-03-31 DIAGNOSIS — Z87891 Personal history of nicotine dependence: Secondary | ICD-10-CM | POA: Insufficient documentation

## 2012-03-31 DIAGNOSIS — E785 Hyperlipidemia, unspecified: Secondary | ICD-10-CM | POA: Insufficient documentation

## 2012-03-31 DIAGNOSIS — I10 Essential (primary) hypertension: Secondary | ICD-10-CM | POA: Insufficient documentation

## 2012-03-31 DIAGNOSIS — I517 Cardiomegaly: Secondary | ICD-10-CM | POA: Insufficient documentation

## 2012-03-31 NOTE — Progress Notes (Signed)
Echocardiogram performed.  

## 2012-04-06 ENCOUNTER — Telehealth: Payer: Self-pay | Admitting: Nurse Practitioner

## 2012-04-06 NOTE — Telephone Encounter (Signed)
[place], You can let them know that he does have a moderate amount of blockage on his aortic valve. His pumping function is normal. No intervention is needed at this time but we are still recommending that he have a pacemaker implanted due to his abnormal rhythm.

## 2012-04-06 NOTE — Telephone Encounter (Signed)
Patient and wife aware of echo results and MD's recommendations. Wife states will talk with Dr. Lovell Sheehan PCP, she will  Call back when pt decides to have the procedure done.

## 2012-04-06 NOTE — Telephone Encounter (Signed)
Patient's wife called for the echo results. Wife is aware that results have not been reviewed to make recommendations at this time by NP.   We will call results  As soon  As echo is reviewed, wife verbalized understanding, she would like to be call at her cell phone # [phone] if she is not  Home.

## 2012-04-06 NOTE — Telephone Encounter (Signed)
Please return call to patient wife [phone] regarding test results

## 2012-04-11 ENCOUNTER — Telehealth: Payer: Self-pay | Admitting: *Deleted

## 2012-04-11 NOTE — Telephone Encounter (Signed)
error 

## 2012-04-13 ENCOUNTER — Encounter: Payer: Self-pay | Admitting: Internal Medicine

## 2012-04-13 ENCOUNTER — Ambulatory Visit (INDEPENDENT_AMBULATORY_CARE_PROVIDER_SITE_OTHER): Payer: Medicare HMO | Admitting: Internal Medicine

## 2012-04-13 VITALS — BP 110/70 | HR 40 | Temp 98.6°F | Resp 16 | Ht 70.0 in | Wt 176.0 lb

## 2012-04-13 DIAGNOSIS — I498 Other specified cardiac arrhythmias: Secondary | ICD-10-CM

## 2012-04-13 DIAGNOSIS — I441 Atrioventricular block, second degree: Secondary | ICD-10-CM

## 2012-04-13 DIAGNOSIS — R001 Bradycardia, unspecified: Secondary | ICD-10-CM

## 2012-04-13 NOTE — Progress Notes (Signed)
Subjective:    Patient ID: MADDOXX BURKITT, male    DOB: 20-Jan-1931, 78 y.o.   MRN: [id number]  HPI Patient is a 78 year old male who was recently seen for heart block and has Mobitz 2 heart block with risk of sudden death.  Had an echocardiogram that shows mild to moderate aortic stenosis we spent extensive time explaining the concept of stenosis of the artery because the patient's wife misunderstood that is describing a blockage rather than a stiffening and narrowing of the artery the point of the valve. He appears to be a good candidate for a pacemaker and this appears to be medically necessary his wife is greatly concerned about his progress has done extensive research and did uncover that his Aricept may be involved in heart block she stopped the medication for over 2 weeks and we will repeat an EKG today but based upon his bradycardia with a pulse of 40 believe that he will still be in Mobitz 2.    Review of Systems  Constitutional: Negative for fever and fatigue.  HENT: Negative for hearing loss, congestion, neck pain and postnasal drip.   Eyes: Negative for discharge, redness and visual disturbance.  Respiratory: Negative for cough, shortness of breath and wheezing.   Cardiovascular: Negative for leg swelling.  Gastrointestinal: Negative for abdominal pain, constipation and abdominal distention.  Genitourinary: Negative for urgency and frequency.  Musculoskeletal: Negative for joint swelling and arthralgias.  Skin: Negative for color change and rash.  Neurological: Negative for weakness and light-headedness.  Hematological: Negative for adenopathy.  Psychiatric/Behavioral: Negative for behavioral problems.   Past Medical History  Diagnosis Date  . Hyperlipidemia   . Hypertension   . Memory loss   . Bradycardia   . Murmur     History   Social History  . Marital Status: Married    Spouse Name: N/A    Number of Children: N/A  . Years of Education: N/A   Occupational  History  . retired    Social History Main Topics  . Smoking status: Former Smoker    Quit date: 01/17/2010  . Smokeless tobacco: Not on file  . Alcohol Use: No  . Drug Use: No  . Sexually Active: Not Currently   Other Topics Concern  . Not on file   Social History Narrative  . No narrative on file    Past Surgical History  Procedure Date  . Appendectomy     Family History  Problem Relation Age of Onset  . Memory loss      Allergies  Allergen Reactions  . Amoxicillin     REACTION: unspecified  . Statins     REACTION: unspecified    Current Outpatient Prescriptions on File Prior to Visit  Medication Sig Dispense Refill  . amLODipine (NORVASC) 5 MG tablet Take 1 tablet by mouth Daily.      . calcium carbonate (OS-CAL) 600 MG TABS Take 600 mg by mouth daily.      . cyanocobalamin 500 MCG tablet Take 500 mcg by mouth daily.      Marland Kitchen donepezil (ARICEPT) 10 MG tablet Take 1 tablet by mouth Daily.      . fish oil-omega-3 fatty acids 1000 MG capsule Take 3 capsules by mouth daily.      . Ginkgo Biloba 40 MG TABS Take 1 tablet by mouth daily.      . Inositol Niacinate 500 MG CAPS Take by mouth.      Marland Kitchen PHOSPHATIDYLSERINE PO Take by mouth.      Marland Kitchen  saw palmetto 500 MG capsule Take 500 mg by mouth daily.        BP 110/70  Pulse 40  Temp 98.6 F (37 C)  Resp 16  Ht 5\' 10"  (1.778 m)  Wt 176 lb (79.833 kg)  BMI 25.25 kg/m2        Objective:   Physical Exam  Nursing note and vitals reviewed. Constitutional: He appears well-developed and well-nourished.  HENT:  Head: Normocephalic and atraumatic.  Eyes: Conjunctivae are normal. Pupils are equal, round, and reactive to light.  Neck: Normal range of motion. Neck supple.  Cardiovascular: Normal rate and regular rhythm.   Pulmonary/Chest: Effort normal and breath sounds normal.  Abdominal: Soft. Bowel sounds are normal.          Assessment & Plan:  There is a definite social dynamics was getting a  pacemaker.  They are in [place] with not too many [place] friends and his wife feels isolated from their family in [place].  She is concerned both about safety and potential risks for the pacemaker placement we discussed in detail that doing nothing was probably the most risky option for sudden death. We performed a repeat EKG to confirm that the Alzheimer's medication did not affect his heart rhythm. We agreed to contact Dr. Johney Frame directly to help to expedite the pacemaker.  Patient was given a copy of his echocardiogram with explanations and drawing I spent 45 minutes face-to-face with patient for which over half was for counseling

## 2012-04-13 NOTE — Patient Instructions (Addendum)
The patient is instructed to continue all medications as prescribed. Schedule followup with check out clerk upon leaving the clinic  You still have a Mobitz type heart block which puts you at risk for sudden death. It is my strongest recommendation that you go ahead with the pacemaker placement and I have called your cardiologist to set up this procedure

## 2012-04-15 ENCOUNTER — Telehealth: Payer: Self-pay | Admitting: *Deleted

## 2012-04-15 ENCOUNTER — Encounter (HOSPITAL_COMMUNITY): Payer: Self-pay | Admitting: Pharmacy Technician

## 2012-04-15 ENCOUNTER — Other Ambulatory Visit: Payer: Self-pay | Admitting: *Deleted

## 2012-04-15 DIAGNOSIS — I498 Other specified cardiac arrhythmias: Secondary | ICD-10-CM

## 2012-04-15 NOTE — Telephone Encounter (Signed)
Spoke with patient's wife.  Per Dr Johney Frame, patient should be scheduled for pacemaker implant.  Scheduled for Monday with Dr Graciela Husbands.  Instructions reviewed with wife.

## 2012-04-17 MED ORDER — GENTAMICIN SULFATE 40 MG/ML IJ SOLN
80.0000 mg | INTRAMUSCULAR | Status: DC
  Filled 2012-04-17: qty 2

## 2012-04-17 MED ORDER — VANCOMYCIN HCL IN DEXTROSE 1-5 GM/200ML-% IV SOLN
1000.0000 mg | INTRAVENOUS | Status: DC
  Filled 2012-04-17: qty 200

## 2012-04-18 ENCOUNTER — Encounter (HOSPITAL_COMMUNITY)
Admission: RE | Disposition: A | Discharge status: Home / Self Care | Payer: Self-pay | Source: Ambulatory Visit | Attending: Internal Medicine

## 2012-04-18 ENCOUNTER — Ambulatory Visit (HOSPITAL_COMMUNITY)
Admission: RE | Admit: 2012-04-18 | Discharge: 2012-04-19 | Disposition: A | Discharge status: Home / Self Care | Payer: Medicare HMO | Source: Ambulatory Visit | Attending: Internal Medicine | Admitting: Internal Medicine

## 2012-04-18 ENCOUNTER — Ambulatory Visit (HOSPITAL_COMMUNITY): Payer: Medicare HMO

## 2012-04-18 DIAGNOSIS — I498 Other specified cardiac arrhythmias: Secondary | ICD-10-CM

## 2012-04-18 DIAGNOSIS — I441 Atrioventricular block, second degree: Secondary | ICD-10-CM

## 2012-04-18 DIAGNOSIS — I359 Nonrheumatic aortic valve disorder, unspecified: Secondary | ICD-10-CM | POA: Insufficient documentation

## 2012-04-18 DIAGNOSIS — E785 Hyperlipidemia, unspecified: Secondary | ICD-10-CM

## 2012-04-18 DIAGNOSIS — Z950 Presence of cardiac pacemaker: Secondary | ICD-10-CM

## 2012-04-18 DIAGNOSIS — I10 Essential (primary) hypertension: Secondary | ICD-10-CM

## 2012-04-18 DIAGNOSIS — I442 Atrioventricular block, complete: Secondary | ICD-10-CM | POA: Insufficient documentation

## 2012-04-18 HISTORY — PX: PERMANENT PACEMAKER INSERTION: SHX5480

## 2012-04-18 LAB — SURGICAL PCR SCREEN: Staphylococcus aureus: POSITIVE — AB

## 2012-04-18 LAB — BASIC METABOLIC PANEL
GFR calc Af Amer: >90 mL/min (ref >90)
GFR calc non Af Amer: 78 mL/min — ABNORMAL LOW (ref >90)
Potassium: 4.1 mEq/L (ref 3.5–5.1)
Sodium: 140 mEq/L (ref 135–145)

## 2012-04-18 LAB — CBC
MCHC: 32.2 g/dL (ref 30.0–36.0)
Platelets: 237 10*3/uL (ref 150–400)
RDW: 17.5 % — ABNORMAL HIGH (ref 11.5–15.5)

## 2012-04-18 LAB — PROTIME-INR
INR: 1.02 (ref 0.00–1.49)
Prothrombin Time: 13.6 seconds (ref 11.6–15.2)

## 2012-04-18 SURGERY — PERMANENT PACEMAKER INSERTION
Anesthesia: LOCAL

## 2012-04-18 MED ORDER — LIDOCAINE HCL (PF) 1 % IJ SOLN
INTRAMUSCULAR | Status: AC
  Filled 2012-04-18: qty 60

## 2012-04-18 MED ORDER — SODIUM CHLORIDE 0.9 % IV SOLN: 250.0000 mL | INTRAVENOUS | Status: DC

## 2012-04-18 MED ORDER — CHLORHEXIDINE GLUCONATE 4 % EX LIQD
60.0000 mL | Freq: Once | CUTANEOUS | Status: DC
  Filled 2012-04-18: qty 60

## 2012-04-18 MED ORDER — CEFAZOLIN SODIUM 1-5 GM-% IV SOLN
INTRAVENOUS | Status: AC
  Filled 2012-04-18: qty 50

## 2012-04-18 MED ORDER — SODIUM CHLORIDE 0.9 % IV SOLN
250.0000 mL | INTRAVENOUS | Status: DC
  Administered 2012-04-18: 11:00:00 via INTRAVENOUS

## 2012-04-18 MED ORDER — LIDOCAINE HCL (PF) 1 % IJ SOLN
INTRAMUSCULAR | Status: AC
  Filled 2012-04-18: qty 30

## 2012-04-18 MED ORDER — VANCOMYCIN HCL IN DEXTROSE 1-5 GM/200ML-% IV SOLN
1000.0000 mg | Freq: Two times a day (BID) | INTRAVENOUS | Status: AC
  Administered 2012-04-18: 1000 mg via INTRAVENOUS
  Filled 2012-04-18: qty 200

## 2012-04-18 MED ORDER — VANCOMYCIN HCL IN DEXTROSE 1-5 GM/200ML-% IV SOLN
1000.0000 mg | Freq: Once | INTRAVENOUS | Status: DC
  Filled 2012-04-18: qty 200

## 2012-04-18 MED ORDER — CYANOCOBALAMIN 500 MCG PO TABS
500.0000 ug | ORAL_TABLET | Freq: Every day | ORAL | Status: DC
  Administered 2012-04-18: 500 ug via ORAL
  Filled 2012-04-18 (×2): qty 1

## 2012-04-18 MED ORDER — SODIUM CHLORIDE 0.45 % IV SOLN
INTRAVENOUS | Status: DC
  Administered 2012-04-18: 11:00:00 via INTRAVENOUS

## 2012-04-18 MED ORDER — MIDAZOLAM HCL 5 MG/5ML IJ SOLN
INTRAMUSCULAR | Status: AC
  Filled 2012-04-18: qty 5

## 2012-04-18 MED ORDER — SODIUM CHLORIDE 0.9 % IJ SOLN: 3.0000 mL | Freq: Two times a day (BID) | INTRAMUSCULAR | Status: DC

## 2012-04-18 MED ORDER — SODIUM CHLORIDE 0.9 % IJ SOLN: 3.0000 mL | INTRAMUSCULAR | Status: DC | PRN

## 2012-04-18 MED ORDER — CALCIUM CARBONATE 600 MG PO TABS
600.0000 mg | ORAL_TABLET | Freq: Every day | ORAL | Status: DC
  Filled 2012-04-18: qty 1

## 2012-04-18 MED ORDER — AMLODIPINE BESYLATE 5 MG PO TABS
5.0000 mg | ORAL_TABLET | Freq: Every day | ORAL | Status: DC
  Administered 2012-04-18: 5 mg via ORAL
  Filled 2012-04-18 (×2): qty 1

## 2012-04-18 MED ORDER — MUPIROCIN 2 % EX OINT
TOPICAL_OINTMENT | CUTANEOUS | Status: AC
  Administered 2012-04-18: 11:00:00
  Filled 2012-04-18: qty 22

## 2012-04-18 MED ORDER — SODIUM CHLORIDE 0.9 % IV SOLN
INTRAVENOUS | Status: AC
  Administered 2012-04-18: 16:00:00 via INTRAVENOUS

## 2012-04-18 MED ORDER — FENTANYL CITRATE 0.05 MG/ML IJ SOLN
INTRAMUSCULAR | Status: AC
  Filled 2012-04-18: qty 2

## 2012-04-18 MED ORDER — ONDANSETRON HCL 4 MG/2ML IJ SOLN: 4.0000 mg | Freq: Four times a day (QID) | INTRAMUSCULAR | Status: DC | PRN

## 2012-04-18 MED ORDER — ACETAMINOPHEN 325 MG PO TABS: 325.0000 mg | ORAL_TABLET | ORAL | Status: DC | PRN

## 2012-04-18 NOTE — CV Procedure (Signed)
Preop DX:: high grade symptomatic heart block Post op DX:: same  Procedure  dual pacemaker implantation  After routine prep and drape, lidocaine was infiltrated in the prepectoral subclavicular region on the left side an incision was made and carried down to later the prepectoral fascia using electrocautery and sharp dissection a pocket was formed similarly. Hemostasis was obtained.  After this, we turned our attention to gaining accessm to the extrathoracic,left subclavian vein. This was accomplished without difficulty and without the aspiration of air or puncture of the artery. 2 separate venipunctures were accomplished; guidewires were placed and retained and sequentially 7 French sheath through which were  passed an Medtronic 5086  ventricular lead serial number [id number] and an Medtronic  atrial lead serial number  [id number] .  The ventricular lead was manipulated to the right ventricular apex with a bipolar R wave was 10.6, the pacing impedance was624, the threshold was  1.4 @ 0.5 msec  Current at threshold was   2.5  Ma and the current of injury was  brisk.  The right atrial lead was manipulated to the right atrial appendage with a bipolar P-wave  3.7, the pacing impedance was 948, the threshold1.9@ 0.5 msec   Current at threshold was 2.96ma and the current of injury was brisk.  The ventricular lead was marked with a tie prior to the insertion of the atrial lead. The leads were affixed to the prepectoral fascia and attached to a  Medtronic  pulse generator serial number [id number].  Hemostasis was obtained. The pocket was copiously irrigated with antibiotic containing saline solution. The leads and the pulse generator were placed in the pocket and affixed to the prepectoral fascia. The wound was then closed in 3 layers in the normal fashion. The wound was washed dried and a benzoin Steri-Strip dressing was applied.  Needle  Count, sponge counts and instrument counts were correct at the end  of the procedure .   The patient tolerated the procedure without apparent complication.  Duke Salvia M.D. Marland Kitchen

## 2012-04-18 NOTE — H&P (Addendum)
History and Physical  Patient ID: Lucas Khan MRN: [id number], SOB: [birth year] 78 y.o. Date of Encounter: 04/18/2012, 11:30 AM  Primary Physician: Carrie Mew, MD Primary Cardiologist: [email or web address].Coach Primary Electrophysiologist:  Fawn Kirk  Chief Complaint: BRADYCARDIA  History of Present Illness: Lucas Khan is a 78 y.o. male seen previously by Dr. Johney Frame with mild-moderate aortic stenosis and progressive bradycardia. He is recently also had problems with exercise intolerance manifested by shortness of breath. There has been no edema.  He was seen in evaluation by LG and J.A and identified as having Mobitz second-degree heart block thought to be contributing to the aforementioned symptoms. There is no history of syncope. Ambulation the office failed to improve conduction supportive of an infra-hisian location and pacing was recommended. The patient declined. The patient saw his primary care physician last week who told him that he 2 and had a pacemaker implanted and that I had done at and the patient was agreeable to proceeding.  His wife is quite anxious about his medical history. He appeared previously noted to be on Aricept. She has been seeking nutraceuticals to replace his pharmaceuticals.  Recent echo demonstrated moderate-mild aortic stenosis with normal left ventricular function biatrial enlargement. He has no history of atrial fibrillation. His other main medical issue is memory loss  Past Medical History  Diagnosis Date  . Hyperlipidemia   . Hypertension   . Memory loss   . Bradycardia   . Murmur      Past Surgical History  Procedure Date  . Appendectomy       Current Facility-Administered Medications  Medication Dose Route Frequency Provider Last Rate Last Dose  . 0.45 % sodium chloride infusion   Intravenous Continuous Duke Salvia, MD 50 mL/hr at 04/18/12 1037    . 0.9 %  sodium chloride infusion  250 mL Intravenous Continuous Duke Salvia, MD 20 mL/hr at  04/18/12 1036    . ceFAZolin (ANCEF) 1-5 GM-% IVPB           . chlorhexidine (HIBICLENS) 4 % liquid 4 application  60 mL Topical Once Duke Salvia, MD      . gentamicin (GARAMYCIN) 80 mg in sodium chloride irrigation 0.9 % 500 mL irrigation  80 mg Irrigation On Call Duke Salvia, MD      . mupirocin ointment (BACTROBAN) 2 %           . sodium chloride 0.9 % injection 3 mL  3 mL Intravenous Q12H Duke Salvia, MD      . sodium chloride 0.9 % injection 3 mL  3 mL Intravenous PRN Duke Salvia, MD      . vancomycin (VANCOCIN) IVPB 1000 mg/200 mL premix  1,000 mg Intravenous On Call Duke Salvia, MD      . DISCONTD: 0.9 %  sodium chloride infusion  250 mL Intravenous Continuous Duke Salvia, MD         Allergies: Allergies  Allergen Reactions  . Amoxicillin     REACTION: unspecified  . Statins     REACTION: unspecified     History  Substance Use Topics  . Smoking status: Former Smoker    Quit date: 01/17/2010  . Smokeless tobacco: Not on file  . Alcohol Use: No      Family History  Problem Relation Age of Onset  . Memory loss        ROS:  Please see the history of present illness.     All  other systems reviewed and negative.   Vital Signs: Blood pressure 162/76, pulse 40, temperature 97.2 F (36.2 C), temperature source Oral, resp. rate 16, height 5\' 7"  (1.702 m), weight 175 lb (79.379 kg), SpO2 97.00%.  PHYSICAL EXAM: General:  Well nourished, well developed male in no acute distress  HEENT: normal Lymph: no adenopathy Neck: no JVD Endocrine:  No thryomegaly Vascular: No carotid bruits; FA pulses 2+ bilaterally without bruits Cardiac:  normal S1, S2; RRR; rate was slow there is a 2-3/6 systolic murmur along the right upper from border Back: without kyphosis/scoliosis, no CVA tenderness Lungs:  clear to auscultation bilaterally, no wheezing, rhonchi or rales Abd: soft, nontender, no hepatomegaly Ext: no edema Musculoskeletal:  No deformities, BUE and BLE  strength normal and equal Skin: warm and dry Neuro:  CNs 2-12 intact, no focal abnormalities noted Psych:  Normal affect   EKG:  Sinus bradycardia at 45 with a PR interval today of 350 ms. Review of prior tracings demonstrates intermittent dropped beats without PR prolongation  QRS duration was 105 ms  Labs:   Lab Results  Component Value Date   WBC 7.4 04/18/2012   HGB 11.9* 04/18/2012   HCT 37.0* 04/18/2012   MCV 78.7 04/18/2012   PLT 237 04/18/2012    Lab 04/18/12 0928  NA 140  K 4.1  CL 104  CO2 25  BUN 17  CREATININE 0.91  CALCIUM 9.3  PROT --  BILITOT --  ALKPHOS --  ALT --  AST --  GLUCOSE 100*   No results found for this basename: CKTOTAL:4,CKMB:4,TROPONINI:4 in the last 72 hours Lab Results  Component Value Date   CHOL 255* 10/05/2011   HDL 61.10 10/05/2011   TRIG 83.0 10/05/2011   No results found for this basename: DDIMER   BNP No results found for this basename: probnp       ASSESSMENT AND PLAN:   Second degree heart block. The patient has second degree heart block with manifestations of exercise intolerance. He also sinus node dysfunction and profound first degree AV block. Given his associated symptoms it is reasonable to pursue pacing. He has aortic stenosis which may well contribute to the heart block.    The benefits and risks were reviewed including but not limited to death,  perforation, infection, lead dislodgement and device malfunction.  The patient understands agrees and is willing to proceed.

## 2012-04-19 ENCOUNTER — Ambulatory Visit (HOSPITAL_COMMUNITY): Payer: Medicare HMO

## 2012-04-19 MED ORDER — CALCIUM CARBONATE 1250 (500 CA) MG PO TABS
1.0000 | ORAL_TABLET | Freq: Every day | ORAL | Status: DC
  Filled 2012-04-19: qty 1

## 2012-04-19 NOTE — Progress Notes (Signed)
D/c orders received;IV removed with gauze on; pt remains in stable condition, pt meds and instructions reviewed with wife and pt; reviewed arm restrictions/ signs of infection with pt and wife; pt d/c to home

## 2012-04-19 NOTE — Discharge Instructions (Signed)
   Supplemental Discharge Instructions for  Pacemaker/Defibrillator Patients  Activity No heavy lifting or vigorous activity with your left/right arm for 6 to 8 weeks.  Do not raise your left/right arm above your head for one week.  Gradually raise your affected arm as drawn below.           07/04                       07/05                       07/06                      07/07       NO DRIVING for 1 week; you may begin driving on 04/26/2012. WOUND CARE   Keep the wound area clean and dry.  Do not get this area wet for one week. No showers for one week; you may shower on 04/26/2012.   The tape/steri-strips on your wound will fall off; do not pull them off.  No bandage is needed on the site.  DO  NOT apply any creams, oils, or ointments to the wound area.   If you notice any drainage or discharge from the wound, any swelling or bruising at the site, or you develop a fever > 101? F after you are discharged home, call the office at once.  Special Instructions   You are still able to use cellular telephones; use the ear opposite the side where you have your pacemaker/defibrillator.  Avoid carrying your cellular phone near your device.   When traveling through airports, show security personnel your identification card to avoid being screened in the metal detectors.  Ask the security personnel to use the hand wand.   Avoid arc welding equipment, MRI testing (magnetic resonance imaging), TENS units (transcutaneous nerve stimulators).  Call the office for questions about other devices.   Avoid electrical appliances that are in poor condition or are not properly grounded.   Microwave ovens are safe to be near or to operate.  

## 2012-04-19 NOTE — Discharge Summary (Signed)
ELECTROPHYSIOLOGY DISCHARGE SUMMARY    Patient ID: Lucas Khan,  MRN: [id number], DOB/AGE: 76-21-1932 76 y.o.  Admit date: 04/18/2012 Discharge date: 04/19/2012  Primary Care Physician: Carrie Mew, MD Primary Cardiologist: Hillis Range, MD  Primary Discharge Diagnosis:  1. Second degree AV block, profound first degree AV block and sinus node dysfunction with symptomatic bradycardia/exercise intolerance  Secondary Discharge Diagnoses:  1. Aortic stenosis 2. HTN 3. Dyslipidemia  Procedures This Admission:  1. Dual chamber PPM implantation 04/18/2012 Medtronic 5086 ventricular lead serial number [id number]. Medtronic atrial lead serial number [id number]. Medtronic pulse generator serial number [id number].  History and Hospital Course:  Mr. Spanos is a 78 year old gentleman with 2nd degree heart block, profound 1st degree AV block and sinus node dysfunction with symptomatic bradycardia/exercise intolerance who underwent dual chamber PPM implantation yesterday with Dr. Graciela Husbands. He tolerated the procedure well and there were no immediate complications. He was observed overnight and remains hemodynamically stable. He has no complaints this AM and is eager to go home. His device interrogation shows normal PPM function with stable lead measurements. His chest x-ray shows stable lead placement without pneumothorax. His implant site is intact with no evidence of significant bleeding or hematoma. He has been seen, examined and deemed stable for discharge today by Dr. Graciela Husbands.  Physical Exam: Vitals: BP 159/81, pulse 60, temp 98.5 F (36.9 C), resp 16, height 5\' 7"  (1.702 m), weight 175 lb (79.379 kg), SpO2 95% General: Well developed, well appearing 78 year old male in no acute distress. Heart: RRR. S1, S2 present. II/VI systolic murmur. No diastolic murmur, rub, S3 or S4. Lungs: CTA bilaterally. No wheezes, rales or rhonchi. Extremities: No cyanosis, clubbing or edema.  Neuro:  Alert and oriented x 3. No focal deficits.  Labs: Component Value Date   WBC 7.4 04/18/2012   HGB 11.9* 04/18/2012   HCT 37.0* 04/18/2012   MCV 78.7 04/18/2012   PLT 237 04/18/2012    Lab 04/18/12 0928  NA 140  K 4.1  CL 104  CO2 25  BUN 17  CREATININE 0.91  CALCIUM 9.3  PROT --  BILITOT --  ALKPHOS --  ALT --  AST --  GLUCOSE 100*     Basename 04/18/12 0928  INR 1.02    Disposition:  The patient is being discharged in stable condition.  Follow-up:  Follow up with [facility] on 05/02/2012. (At 2:30 PM for wound check)    Contact information:   [address] [place] [address] [phone]    Follow up with Sherryl Manges, MD on 08/09/2012. (At 9:15 AM)    Contact information:   [address] [place] [address] [phone]   Discharge Medications:   TAKE these medications     amLODipine 5 MG tablet   Commonly known as: NORVASC   Take 1 tablet by mouth Daily.      calcium carbonate 600 MG Tabs   Commonly known as: OS-CAL   Take 600 mg by mouth daily.      cyanocobalamin 500 MCG tablet   Take 500 mcg by mouth daily.      fish oil-omega-3 fatty acids 1000 MG capsule   Take 3 g by mouth daily.      ginkgo biloba 40 MG Tabs   Take 1 tablet by mouth daily.      OTC phosphatidylserine   Take 1 tablet by mouth daily.  saw palmetto 500 MG capsule   Take 500 mg by mouth daily.      Duration of Discharge Encounter: Greater than 30 minutes including physician time.  Limmie Patricia, PA-C 04/19/2012, 8:09 AM  Sherryl Manges, MD 04/19/2012 9:25 AM

## 2012-04-20 ENCOUNTER — Encounter: Payer: Self-pay | Admitting: *Deleted

## 2012-04-20 DIAGNOSIS — Z950 Presence of cardiac pacemaker: Secondary | ICD-10-CM | POA: Insufficient documentation

## 2012-04-27 ENCOUNTER — Telehealth: Payer: Self-pay | Admitting: Internal Medicine

## 2012-04-27 NOTE — Telephone Encounter (Signed)
Dr. Graciela Husbands called and spoke with the patient's wife.

## 2012-04-27 NOTE — Telephone Encounter (Signed)
New Problem:    Patient's wife called in because since he had his pacemaker implant he has been unusually fatigued and sleeps quite often.  Please call back.

## 2012-05-02 ENCOUNTER — Ambulatory Visit: Payer: Medicare HMO | Admitting: Internal Medicine

## 2012-05-02 ENCOUNTER — Ambulatory Visit (INDEPENDENT_AMBULATORY_CARE_PROVIDER_SITE_OTHER): Payer: Medicare HMO | Admitting: *Deleted

## 2012-05-02 ENCOUNTER — Encounter: Payer: Self-pay | Admitting: Internal Medicine

## 2012-05-02 DIAGNOSIS — I498 Other specified cardiac arrhythmias: Secondary | ICD-10-CM

## 2012-05-02 DIAGNOSIS — R001 Bradycardia, unspecified: Secondary | ICD-10-CM

## 2012-05-02 NOTE — Progress Notes (Signed)
Wound check-PPM 

## 2012-05-03 LAB — PACEMAKER DEVICE OBSERVATION
ATRIAL PACING PM: 31
BAMS-0001: 150 {beats}/min
RV LEAD THRESHOLD: 1 V
VENTRICULAR PACING PM: 100

## 2012-05-27 ENCOUNTER — Encounter: Payer: Self-pay | Admitting: Internal Medicine

## 2012-05-27 ENCOUNTER — Ambulatory Visit (INDEPENDENT_AMBULATORY_CARE_PROVIDER_SITE_OTHER): Payer: Medicare HMO | Admitting: Internal Medicine

## 2012-05-27 VITALS — BP 140/80 | HR 72 | Temp 98.0°F | Resp 16 | Ht 70.0 in | Wt 174.0 lb

## 2012-05-27 DIAGNOSIS — I10 Essential (primary) hypertension: Secondary | ICD-10-CM

## 2012-05-27 DIAGNOSIS — R001 Bradycardia, unspecified: Secondary | ICD-10-CM

## 2012-05-27 DIAGNOSIS — I498 Other specified cardiac arrhythmias: Secondary | ICD-10-CM

## 2012-05-27 DIAGNOSIS — R413 Other amnesia: Secondary | ICD-10-CM

## 2012-05-27 NOTE — Progress Notes (Signed)
Subjective:    Patient ID: Lucas Khan, male    DOB: [birth year], 78 y.o.   MRN: [id number]  HPI  The pt has been "very tired" acordding to his wife She is concerned about taking a nap He has noted awaking during the night  And she feels that this is effecting his sleep  Review of Systems  Constitutional: Negative for fever and fatigue.  HENT: Negative for hearing loss, congestion, neck pain and postnasal drip.   Eyes: Negative for discharge, redness and visual disturbance.  Respiratory: Negative for cough, shortness of breath and wheezing.   Cardiovascular: Negative for leg swelling.  Gastrointestinal: Negative for abdominal pain, constipation and abdominal distention.  Genitourinary: Positive for frequency and decreased urine volume. Negative for urgency.  Musculoskeletal: Negative for joint swelling and arthralgias.  Skin: Negative for color change and rash.  Neurological: Negative for weakness and light-headedness.  Hematological: Negative for adenopathy.  Psychiatric/Behavioral: Negative for behavioral problems.   Past Medical History  Diagnosis Date  . Hyperlipidemia   . Hypertension   . Memory loss   . Bradycardia   . Murmur     History   Social History  . Marital Status: Married    Spouse Name: N/A    Number of Children: N/A  . Years of Education: N/A   Occupational History  . retired    Social History Main Topics  . Smoking status: Former Smoker    Quit date: 01/17/2010  . Smokeless tobacco: Not on file  . Alcohol Use: No  . Drug Use: No  . Sexually Active: Not Currently   Other Topics Concern  . Not on file   Social History Narrative  . No narrative on file    Past Surgical History  Procedure Date  . Appendectomy     Family History  Problem Relation Age of Onset  . Memory loss      Allergies  Allergen Reactions  . Amoxicillin     REACTION: unspecified  . Statins     REACTION: unspecified    Current Outpatient Prescriptions on  File Prior to Visit  Medication Sig Dispense Refill  . amLODipine (NORVASC) 5 MG tablet Take 1 tablet by mouth Daily.      . calcium carbonate (OS-CAL) 600 MG TABS Take 600 mg by mouth daily.      . cyanocobalamin 500 MCG tablet Take 500 mcg by mouth daily.      . fish oil-omega-3 fatty acids 1000 MG capsule Take 3 g by mouth daily.       . Ginkgo Biloba 40 MG TABS Take 1 tablet by mouth daily.      Marland Kitchen PHOSPHATIDYLSERINE PO Take 1 tablet by mouth daily.       . saw palmetto 500 MG capsule Take 500 mg by mouth daily.        BP 140/80  Pulse 72  Temp 98 F (36.7 C)  Resp 16  Ht 5\' 10"  (1.778 m)  Wt 174 lb (78.926 kg)  BMI 24.97 kg/m2       Objective:   Physical Exam  Constitutional: He appears well-developed and well-nourished.  HENT:  Head: Normocephalic and atraumatic.  Eyes: Conjunctivae are normal. Pupils are equal, round, and reactive to light.  Neck: Normal range of motion. Neck supple.  Cardiovascular: Normal rate and regular rhythm.   Pulmonary/Chest: Effort normal and breath sounds normal.  Abdominal: Soft. Bowel sounds are normal.          Assessment &  Plan:

## 2012-05-27 NOTE — Patient Instructions (Addendum)
The patient is instructed to continue all medications as prescribed. Schedule followup with check out clerk upon leaving the clinic  

## 2012-07-21 NOTE — Progress Notes (Signed)
  Subjective:    Patient ID: Lucas Khan, male    DOB: January 14, 1931, 78 y.o.   MRN: [id number]  HPI  Monitoring of hypertension  Review of Systems     Objective:   Physical Exam        Assessment & Plan:  Blood pressure check Flu shot administered

## 2012-08-05 ENCOUNTER — Ambulatory Visit (INDEPENDENT_AMBULATORY_CARE_PROVIDER_SITE_OTHER): Payer: Medicare HMO

## 2012-08-05 DIAGNOSIS — Z23 Encounter for immunization: Secondary | ICD-10-CM

## 2012-08-09 ENCOUNTER — Encounter: Payer: Self-pay | Admitting: Internal Medicine

## 2012-08-09 ENCOUNTER — Ambulatory Visit (INDEPENDENT_AMBULATORY_CARE_PROVIDER_SITE_OTHER): Payer: Medicare HMO | Admitting: Internal Medicine

## 2012-08-09 VITALS — BP 134/79 | HR 68 | Ht 70.0 in | Wt 174.0 lb

## 2012-08-09 DIAGNOSIS — I441 Atrioventricular block, second degree: Secondary | ICD-10-CM | POA: Insufficient documentation

## 2012-08-09 DIAGNOSIS — Z950 Presence of cardiac pacemaker: Secondary | ICD-10-CM

## 2012-08-09 DIAGNOSIS — I10 Essential (primary) hypertension: Secondary | ICD-10-CM

## 2012-08-09 DIAGNOSIS — I350 Nonrheumatic aortic (valve) stenosis: Secondary | ICD-10-CM | POA: Insufficient documentation

## 2012-08-09 DIAGNOSIS — I498 Other specified cardiac arrhythmias: Secondary | ICD-10-CM

## 2012-08-09 DIAGNOSIS — I359 Nonrheumatic aortic valve disorder, unspecified: Secondary | ICD-10-CM

## 2012-08-09 DIAGNOSIS — R001 Bradycardia, unspecified: Secondary | ICD-10-CM

## 2012-08-09 NOTE — Assessment & Plan Note (Signed)
About 100% ventricular pacing

## 2012-08-09 NOTE — Progress Notes (Signed)
Patient Care Team: Stacie Glaze, MD as PCP - General   HPI  Lucas Khan is a 78 y.o. male Seen in followup for pacemaker implanted for second-degree heart block with exercise intolerance. This was 7/13.  She has noted no significant improvement in exercise tolerance.  He continues to sleep a lot.  She was also worried a great deal about his lipid situation. He is very high LDL in the 150- 200 range. He does not take statins. She is trying to modify by diet. Echo 613 demonstrated normal left ventricular function moderate left ear with a mean gradient of 23 mild LAE  Past Medical History  Diagnosis Date  . Hyperlipidemia   . Hypertension   . Memory loss   . Bradycardia   . Murmur     Past Surgical History  Procedure Date  . Appendectomy     Current Outpatient Prescriptions  Medication Sig Dispense Refill  . amLODipine (NORVASC) 5 MG tablet Take 1 tablet by mouth Daily.      . calcium carbonate (OS-CAL) 600 MG TABS Take 600 mg by mouth daily.      . cyanocobalamin 500 MCG tablet Take 500 mcg by mouth daily.      . fish oil-omega-3 fatty acids 1000 MG capsule Take 3 g by mouth daily.       . Ginkgo Biloba 40 MG TABS Take 1 tablet by mouth daily.      Marland Kitchen PHOSPHATIDYLSERINE PO Take 1 tablet by mouth daily.       . saw palmetto 500 MG capsule Take 500 mg by mouth daily.        Allergies  Allergen Reactions  . Amoxicillin     REACTION: unspecified  . Statins     REACTION: unspecified    Review of Systems negative except from HPI and PMH  Physical Exam There were no vitals taken for this visit. Well developed and well nourished in no acute distress HENT normal xanthoma  E scleral and icterus clear Neck Supple JVP flat; carotids brisk and full Clear to ausculation Device pocket well healed; without hematoma there is minimal erythema egular rate and rhythm, no murmurs gallops or rub Soft with active bowel sounds No clubbing cyanosis none Edema Alert and  oriented, grossly normal motor and sensory function Skin Warm and Dry    Assessment and  Plan

## 2012-08-09 NOTE — Assessment & Plan Note (Signed)
Stable continue to follow

## 2012-08-09 NOTE — Assessment & Plan Note (Signed)
Borderline elevation. His wife notes that the blood pressure 110-120 often home. He is on low-dose amlodipine. We'll undertake a file of exclusion. She will followup with Dr. Lovell Sheehan about this.

## 2012-08-09 NOTE — Assessment & Plan Note (Signed)
The patient's device was interrogated and the information was fully reviewed.  The device was reprogrammed to  Maximize llongeviy

## 2012-08-09 NOTE — Patient Instructions (Addendum)
Your physician wants you to follow-up in: July 2014 with Dr. Graciela Husbands.  You will receive a reminder letter in the mail two months in advance. If you don't receive a letter, please call our office to schedule the follow-up appointment.

## 2012-08-12 LAB — PACEMAKER DEVICE OBSERVATION
AL AMPLITUDE: 4 mv
AL THRESHOLD: 0.75 V
RV LEAD AMPLITUDE: 11.2 mv
RV LEAD IMPEDENCE PM: 532 Ohm
RV LEAD THRESHOLD: 0.5 V

## 2012-09-20 ENCOUNTER — Other Ambulatory Visit: Payer: Self-pay | Admitting: *Deleted

## 2012-09-20 MED ORDER — AMLODIPINE BESYLATE 5 MG PO TABS: 5.0000 mg | ORAL_TABLET | Freq: Every day | ORAL | Status: DC

## 2012-11-08 ENCOUNTER — Other Ambulatory Visit (INDEPENDENT_AMBULATORY_CARE_PROVIDER_SITE_OTHER): Payer: Medicare HMO

## 2012-11-08 DIAGNOSIS — E785 Hyperlipidemia, unspecified: Secondary | ICD-10-CM

## 2012-11-08 DIAGNOSIS — Z0000 Encounter for general adult medical examination without abnormal findings: Secondary | ICD-10-CM

## 2012-11-08 DIAGNOSIS — I10 Essential (primary) hypertension: Secondary | ICD-10-CM

## 2012-11-08 DIAGNOSIS — N401 Enlarged prostate with lower urinary tract symptoms: Secondary | ICD-10-CM

## 2012-11-08 LAB — LIPID PANEL
HDL: 60.2 mg/dL (ref >39.00)
Total CHOL/HDL Ratio: 4
Triglycerides: 66 mg/dL (ref 0.0–149.0)

## 2012-11-08 LAB — CBC WITH DIFFERENTIAL/PLATELET
Basophils Absolute: 0 10*3/uL (ref 0.0–0.1)
HCT: 37.7 % — ABNORMAL LOW (ref 39.0–52.0)
Lymphs Abs: 1.7 10*3/uL (ref 0.7–4.0)
Monocytes Relative: 13.7 % — ABNORMAL HIGH (ref 3.0–12.0)
Platelets: 281 10*3/uL (ref 150.0–400.0)
RDW: 18.4 % — ABNORMAL HIGH (ref 11.5–14.6)

## 2012-11-08 LAB — POCT URINALYSIS DIPSTICK
Blood, UA: NEGATIVE
Glucose, UA: NEGATIVE
Spec Grav, UA: 1.025

## 2012-11-08 LAB — HEPATIC FUNCTION PANEL
AST: 18 U/L (ref 0–37)
Total Bilirubin: 0.6 mg/dL (ref 0.3–1.2)

## 2012-11-08 LAB — BASIC METABOLIC PANEL
BUN: 16 mg/dL (ref 6–23)
Calcium: 9.1 mg/dL (ref 8.4–10.5)
GFR: 86.31 mL/min (ref >60.00)
Glucose, Bld: 108 mg/dL — ABNORMAL HIGH (ref 70–99)
Potassium: 4.6 mEq/L (ref 3.5–5.1)

## 2012-11-08 LAB — TSH: TSH: 2.59 u[IU]/mL (ref 0.35–5.50)

## 2012-11-08 LAB — LDL CHOLESTEROL, DIRECT: Direct LDL: 165.1 mg/dL

## 2012-11-15 ENCOUNTER — Encounter: Payer: Medicare HMO | Admitting: Internal Medicine

## 2012-11-21 ENCOUNTER — Ambulatory Visit (INDEPENDENT_AMBULATORY_CARE_PROVIDER_SITE_OTHER): Payer: Medicare HMO | Admitting: Internal Medicine

## 2012-11-21 ENCOUNTER — Encounter: Payer: Self-pay | Admitting: Internal Medicine

## 2012-11-21 VITALS — BP 140/70 | HR 70 | Temp 97.4°F | Resp 18 | Ht 69.0 in | Wt 180.0 lb

## 2012-11-21 DIAGNOSIS — Z0000 Encounter for general adult medical examination without abnormal findings: Secondary | ICD-10-CM

## 2012-11-21 NOTE — Progress Notes (Signed)
Subjective:    Patient ID: Lucas Khan, male    DOB: September 28, 1931, 79 y.o.   MRN: [id number]  HPI Medicare wellness with PPO medicare History of progressive dementia.  He also has aortic stenosis hypertension hyperlipidemia and BPH with obstruction.  He is stable from the standpoint of his medical problems his repeat blood pressure was 130/72 he stable weight is stable nutritional status.  His wife reports that he is increasingly fatigued   Review of Systems  Constitutional: Negative for fever and fatigue.  HENT: Negative for hearing loss, congestion, neck pain and postnasal drip.   Eyes: Negative for discharge, redness and visual disturbance.  Respiratory: Negative for cough, shortness of breath and wheezing.   Cardiovascular: Negative for leg swelling.  Gastrointestinal: Negative for abdominal pain, constipation and abdominal distention.  Genitourinary: Negative for urgency and frequency.  Musculoskeletal: Negative for joint swelling and arthralgias.  Skin: Negative for color change and rash.  Neurological: Negative for weakness and light-headedness.  Hematological: Negative for adenopathy.  Psychiatric/Behavioral: Negative for behavioral problems.       Past Medical History  Diagnosis Date  . Hyperlipidemia   . Hypertension   . Memory loss   . Mobitz type 2 second degree atrioventricular block   . Aortic stenosis     Mean gradient 23  6/13  . Pacemaker - Medtronic     DOI-7/13    History   Social History  . Marital Status: Married    Spouse Name: N/A    Number of Children: N/A  . Years of Education: N/A   Occupational History  . retired    Social History Main Topics  . Smoking status: Former Smoker    Quit date: 01/17/2010  . Smokeless tobacco: Not on file  . Alcohol Use: No  . Drug Use: No  . Sexually Active: Not Currently   Other Topics Concern  . Not on file   Social History Narrative  . No narrative on file    Past Surgical History   Procedure Date  . Appendectomy     Family History  Problem Relation Age of Onset  . Memory loss      Allergies  Allergen Reactions  . Amoxicillin     REACTION: unspecified  . Statins     REACTION: unspecified    Current Outpatient Prescriptions on File Prior to Visit  Medication Sig Dispense Refill  . amLODipine (NORVASC) 5 MG tablet Take 1 tablet (5 mg total) by mouth daily.  90 tablet  3  . calcium carbonate (OS-CAL) 600 MG TABS Take 600 mg by mouth daily.      . cyanocobalamin 500 MCG tablet Take 500 mcg by mouth daily.      . fish oil-omega-3 fatty acids 1000 MG capsule Take 3 g by mouth daily.       . Ginkgo Biloba 40 MG TABS Take 1 tablet by mouth daily.      Marland Kitchen PHOSPHATIDYLSERINE PO Take 1 tablet by mouth daily.       . saw palmetto 500 MG capsule Take 500 mg by mouth daily.        BP 140/70  Pulse 70  Temp 97.4 F (36.3 C) (Oral)  Resp 18  Ht 5\' 9"  (1.753 m)  Wt 180 lb (81.647 kg)  BMI 26.58 kg/m2  SpO2 98%    Objective:   Physical Exam  Nursing note and vitals reviewed. Constitutional: He is oriented to person, place, and time. He appears well-developed and well-nourished.  HENT:  Head: Normocephalic and atraumatic.  Eyes: Conjunctivae normal are normal. Pupils are equal, round, and reactive to light.  Neck: Normal range of motion. Neck supple.  Cardiovascular: Normal rate and regular rhythm.   Pulmonary/Chest: Effort normal and breath sounds normal.  Abdominal: Soft. Bowel sounds are normal.  Genitourinary: Rectum normal.       Large  prostate  Musculoskeletal: Normal range of motion.  Neurological: He is alert and oriented to person, place, and time.  Skin: Skin is warm and dry.  Psychiatric: He has a normal mood and affect. His behavior is normal.          Assessment & Plan:   Patient presents for yearly preventative medicine examination.   all immunizations and health maintenance protocols were reviewed with the patient and they are up  to date with these protocols.   screening laboratory values were reviewed with the patient including screening of hyperlipidemia PSA renal function and hepatic function.   There medications past medical history social history problem list and allergies were reviewed in detail.   Goals were established with regard to weight loss exercise diet in compliance with medications  Patient's anemia is improved from 11.9 4.0 he still displays significant allergies with vasomotor rhinitis and increased monocytes.  His blood glucose is moderately elevated at 108 and this is a complicated by his "sweet tooth eating candy PSA remains stable at 0.3 TSH is stable he reports increased fatigue I do not find any metabolic reason for his fatigue other than fact that he is not participating in regular exercise.

## 2012-11-21 NOTE — Patient Instructions (Signed)
Watch concentrated sweets

## 2013-03-08 ENCOUNTER — Telehealth: Payer: Self-pay | Admitting: Internal Medicine

## 2013-03-08 NOTE — Telephone Encounter (Signed)
Patient has appt 6/9. Wife has new concerns and wants to know if Dr Lovell Sheehan can see him very soon. In the last 1-2 days, she said Ridley has no appetite. When he does eat, he has to sit down, then lie down. Also, he can hardly walk now. Almost overnight, she said he has extreme difficulty with it. Unsteady, very slow, etc. [facility] and she's had to leave him at home the last 2 times. When he sits to use the bathroom, he has to hold on to everything just to get back up. She said that his dementia is not worse, and his cognitive ability has not changed. He states he has no pain in legs or otherwise. Please advise and call her or let me know if we can work him in somewhere.

## 2013-03-08 NOTE — Telephone Encounter (Signed)
Appointment made for 5-30 and pt informed

## 2013-03-09 ENCOUNTER — Emergency Department (HOSPITAL_COMMUNITY): Payer: Medicare HMO

## 2013-03-09 ENCOUNTER — Encounter (HOSPITAL_COMMUNITY): Payer: Self-pay | Admitting: *Deleted

## 2013-03-09 ENCOUNTER — Observation Stay (HOSPITAL_COMMUNITY)
Admission: EM | Admit: 2013-03-09 | Discharge: 2013-03-11 | Disposition: A | Discharge status: Skilled Nursing Facility | Payer: Medicare HMO | Attending: Internal Medicine | Admitting: Internal Medicine

## 2013-03-09 ENCOUNTER — Inpatient Hospital Stay (HOSPITAL_COMMUNITY): Payer: Medicare HMO

## 2013-03-09 DIAGNOSIS — I350 Nonrheumatic aortic (valve) stenosis: Secondary | ICD-10-CM

## 2013-03-09 DIAGNOSIS — R531 Weakness: Secondary | ICD-10-CM

## 2013-03-09 DIAGNOSIS — D72829 Elevated white blood cell count, unspecified: Secondary | ICD-10-CM | POA: Insufficient documentation

## 2013-03-09 DIAGNOSIS — I441 Atrioventricular block, second degree: Secondary | ICD-10-CM | POA: Insufficient documentation

## 2013-03-09 DIAGNOSIS — F0390 Unspecified dementia without behavioral disturbance: Secondary | ICD-10-CM | POA: Insufficient documentation

## 2013-03-09 DIAGNOSIS — E785 Hyperlipidemia, unspecified: Secondary | ICD-10-CM | POA: Insufficient documentation

## 2013-03-09 DIAGNOSIS — Z950 Presence of cardiac pacemaker: Secondary | ICD-10-CM | POA: Insufficient documentation

## 2013-03-09 DIAGNOSIS — J309 Allergic rhinitis, unspecified: Secondary | ICD-10-CM

## 2013-03-09 DIAGNOSIS — R413 Other amnesia: Principal | ICD-10-CM | POA: Insufficient documentation

## 2013-03-09 DIAGNOSIS — T887XXA Unspecified adverse effect of drug or medicament, initial encounter: Secondary | ICD-10-CM

## 2013-03-09 DIAGNOSIS — N401 Enlarged prostate with lower urinary tract symptoms: Secondary | ICD-10-CM | POA: Diagnosis present

## 2013-03-09 DIAGNOSIS — N138 Other obstructive and reflux uropathy: Secondary | ICD-10-CM | POA: Insufficient documentation

## 2013-03-09 DIAGNOSIS — I10 Essential (primary) hypertension: Secondary | ICD-10-CM | POA: Insufficient documentation

## 2013-03-09 DIAGNOSIS — J069 Acute upper respiratory infection, unspecified: Secondary | ICD-10-CM | POA: Insufficient documentation

## 2013-03-09 DIAGNOSIS — R5381 Other malaise: Secondary | ICD-10-CM | POA: Insufficient documentation

## 2013-03-09 DIAGNOSIS — I359 Nonrheumatic aortic valve disorder, unspecified: Secondary | ICD-10-CM | POA: Insufficient documentation

## 2013-03-09 DIAGNOSIS — Z79899 Other long term (current) drug therapy: Secondary | ICD-10-CM | POA: Insufficient documentation

## 2013-03-09 DIAGNOSIS — Z9181 History of falling: Secondary | ICD-10-CM | POA: Insufficient documentation

## 2013-03-09 DIAGNOSIS — R5383 Other fatigue: Secondary | ICD-10-CM | POA: Insufficient documentation

## 2013-03-09 LAB — URINALYSIS, ROUTINE W REFLEX MICROSCOPIC
Bilirubin Urine: NEGATIVE
Glucose, UA: NEGATIVE mg/dL
Hgb urine dipstick: NEGATIVE
Ketones, ur: 15 mg/dL — AB
Leukocytes, UA: NEGATIVE
Nitrite: NEGATIVE
Protein, ur: NEGATIVE mg/dL
Specific Gravity, Urine: 1.024 (ref 1.005–1.030)
Urobilinogen, UA: 1 mg/dL (ref 0.0–1.0)
pH: 6.5 (ref 5.0–8.0)

## 2013-03-09 LAB — CG4 I-STAT (LACTIC ACID): Lactic Acid, Venous: 1.38 mmol/L (ref 0.5–2.2)

## 2013-03-09 LAB — CBC WITH DIFFERENTIAL/PLATELET
Basophils Absolute: 0 10*3/uL (ref 0.0–0.1)
Basophils Relative: 0 % (ref 0–1)
Eosinophils Absolute: 0 10*3/uL (ref 0.0–0.7)
Eosinophils Relative: 0 % (ref 0–5)
HCT: 29.2 % — ABNORMAL LOW (ref 39.0–52.0)
Hemoglobin: 9.4 g/dL — ABNORMAL LOW (ref 13.0–17.0)
Lymphocytes Relative: 11 % — ABNORMAL LOW (ref 12–46)
Lymphs Abs: 1.5 10*3/uL (ref 0.7–4.0)
MCH: 23.3 pg — ABNORMAL LOW (ref 26.0–34.0)
MCHC: 32.2 g/dL (ref 30.0–36.0)
MCV: 72.3 fL — ABNORMAL LOW (ref 78.0–100.0)
Monocytes Absolute: 2.7 10*3/uL — ABNORMAL HIGH (ref 0.1–1.0)
Monocytes Relative: 21 % — ABNORMAL HIGH (ref 3–12)
Neutro Abs: 8.5 10*3/uL — ABNORMAL HIGH (ref 1.7–7.7)
Neutrophils Relative %: 67 % (ref 43–77)
Platelets: 283 10*3/uL (ref 150–400)
RBC: 4.04 MIL/uL — ABNORMAL LOW (ref 4.22–5.81)
RDW: 16.8 % — ABNORMAL HIGH (ref 11.5–15.5)
WBC: 12.7 10*3/uL — ABNORMAL HIGH (ref 4.0–10.5)

## 2013-03-09 LAB — BASIC METABOLIC PANEL
BUN: 13 mg/dL (ref 6–23)
CO2: 23 mEq/L (ref 19–32)
Calcium: 8.8 mg/dL (ref 8.4–10.5)
Chloride: 102 mEq/L (ref 96–112)
Creatinine, Ser: 0.64 mg/dL (ref 0.50–1.35)
GFR calc Af Amer: >90 mL/min (ref >90)
GFR calc non Af Amer: >90 mL/min (ref >90)
Glucose, Bld: 125 mg/dL — ABNORMAL HIGH (ref 70–99)
Potassium: 4.1 mEq/L (ref 3.5–5.1)
Sodium: 135 mEq/L (ref 135–145)

## 2013-03-09 LAB — HEPATIC FUNCTION PANEL
ALT: 17 U/L (ref 0–53)
AST: 26 U/L (ref 0–37)
Albumin: 2.6 g/dL — ABNORMAL LOW (ref 3.5–5.2)
Alkaline Phosphatase: 113 U/L (ref 39–117)
Bilirubin, Direct: 0.2 mg/dL (ref 0.0–0.3)
Indirect Bilirubin: 0.5 mg/dL (ref 0.3–0.9)
Total Bilirubin: 0.7 mg/dL (ref 0.3–1.2)
Total Protein: 6.8 g/dL (ref 6.0–8.3)

## 2013-03-09 LAB — TROPONIN I: Troponin I: <0.3 ng/mL (ref <0.30)

## 2013-03-09 LAB — PROTIME-INR
INR: 1.05 (ref 0.00–1.49)
Prothrombin Time: 13.6 seconds (ref 11.6–15.2)

## 2013-03-09 MED ORDER — GUAIFENESIN-DM 100-10 MG/5ML PO SYRP: 5.0000 mL | ORAL_SOLUTION | ORAL | Status: DC | PRN

## 2013-03-09 MED ORDER — ALBUTEROL SULFATE (5 MG/ML) 0.5% IN NEBU: 2.5000 mg | INHALATION_SOLUTION | RESPIRATORY_TRACT | Status: DC | PRN

## 2013-03-09 MED ORDER — ONDANSETRON HCL 4 MG/2ML IJ SOLN: 4.0000 mg | Freq: Four times a day (QID) | INTRAMUSCULAR | Status: DC | PRN

## 2013-03-09 MED ORDER — POLYETHYLENE GLYCOL 3350 17 G PO PACK
17.0000 g | PACK | Freq: Every day | ORAL | Status: DC | PRN
  Administered 2013-03-11: 17 g via ORAL
  Filled 2013-03-09: qty 1

## 2013-03-09 MED ORDER — HEPARIN SODIUM (PORCINE) 5000 UNIT/ML IJ SOLN
5000.0000 [IU] | Freq: Three times a day (TID) | INTRAMUSCULAR | Status: DC
  Administered 2013-03-09 – 2013-03-11 (×5): 5000 [IU] via SUBCUTANEOUS
  Filled 2013-03-09 (×8): qty 1

## 2013-03-09 MED ORDER — ASPIRIN 81 MG PO CHEW
81.0000 mg | CHEWABLE_TABLET | Freq: Every day | ORAL | Status: DC
  Administered 2013-03-09 – 2013-03-11 (×3): 81 mg via ORAL
  Filled 2013-03-09 (×3): qty 1

## 2013-03-09 MED ORDER — HYDROCODONE-ACETAMINOPHEN 5-325 MG PO TABS
1.0000 | ORAL_TABLET | ORAL | Status: DC | PRN
  Administered 2013-03-09: 2 via ORAL
  Filled 2013-03-09: qty 2

## 2013-03-09 MED ORDER — METOPROLOL TARTRATE 50 MG PO TABS
50.0000 mg | ORAL_TABLET | Freq: Two times a day (BID) | ORAL | Status: DC
  Administered 2013-03-09 – 2013-03-11 (×4): 50 mg via ORAL
  Filled 2013-03-09 (×5): qty 1

## 2013-03-09 MED ORDER — SODIUM CHLORIDE 0.9 % IJ SOLN
3.0000 mL | Freq: Two times a day (BID) | INTRAMUSCULAR | Status: DC
  Administered 2013-03-10 – 2013-03-11 (×3): 3 mL via INTRAVENOUS

## 2013-03-09 MED ORDER — ONDANSETRON HCL 4 MG PO TABS: 4.0000 mg | ORAL_TABLET | Freq: Four times a day (QID) | ORAL | Status: DC | PRN

## 2013-03-09 MED ORDER — CALCIUM CARBONATE 600 MG PO TABS
600.0000 mg | ORAL_TABLET | Freq: Every day | ORAL | Status: DC
  Filled 2013-03-09 (×2): qty 1

## 2013-03-09 MED ORDER — DEXTROSE 5 % IV SOLN
500.0000 mg | Freq: Once | INTRAVENOUS | Status: AC
  Administered 2013-03-09: 500 mg via INTRAVENOUS
  Filled 2013-03-09: qty 500

## 2013-03-09 MED ORDER — OMEGA-3 FATTY ACIDS 1000 MG PO CAPS: 3.0000 g | ORAL_CAPSULE | Freq: Every day | ORAL | Status: DC

## 2013-03-09 MED ORDER — DEXTROSE 5 % IV SOLN: 250.0000 mg | INTRAVENOUS | Status: DC

## 2013-03-09 MED ORDER — OMEGA-3-ACID ETHYL ESTERS 1 G PO CAPS
3.0000 g | ORAL_CAPSULE | Freq: Every day | ORAL | Status: DC
  Administered 2013-03-09 – 2013-03-11 (×3): 3 g via ORAL
  Filled 2013-03-09 (×3): qty 3

## 2013-03-09 MED ORDER — POTASSIUM CHLORIDE IN NACL 20-0.9 MEQ/L-% IV SOLN
INTRAVENOUS | Status: AC
  Administered 2013-03-09: 75 mL/h via INTRAVENOUS
  Administered 2013-03-10: 10:00:00 via INTRAVENOUS
  Filled 2013-03-09 (×2): qty 1000

## 2013-03-09 MED ORDER — SODIUM CHLORIDE 0.9 % IV BOLUS (SEPSIS)
1000.0000 mL | Freq: Once | INTRAVENOUS | Status: AC
  Administered 2013-03-09: 1000 mL via INTRAVENOUS

## 2013-03-09 MED ORDER — CYANOCOBALAMIN 500 MCG PO TABS
500.0000 ug | ORAL_TABLET | Freq: Every day | ORAL | Status: DC
  Administered 2013-03-09 – 2013-03-11 (×3): 500 ug via ORAL
  Filled 2013-03-09 (×3): qty 1

## 2013-03-09 MED ORDER — CALCIUM CARBONATE 1250 (500 CA) MG PO TABS
1.0000 | ORAL_TABLET | Freq: Every day | ORAL | Status: DC
  Administered 2013-03-10: 500 mg via ORAL
  Filled 2013-03-09 (×3): qty 1

## 2013-03-09 MED ORDER — HALOPERIDOL LACTATE 5 MG/ML IJ SOLN: 1.0000 mg | Freq: Four times a day (QID) | INTRAMUSCULAR | Status: DC | PRN

## 2013-03-09 NOTE — H&P (Addendum)
[facility]                                                                                    Patient Demographics  Lucas Khan, is a 79 y.o. male  CSN: [id number]  MRN: [id number]  DOB - [birth year]  Admit Date - 03/09/2013  Outpatient Primary MD for the patient is Carrie Mew, MD   With History of -  Past Medical History  Diagnosis Date  . Hyperlipidemia   . Hypertension   . Memory loss   . Mobitz type 2 second degree atrioventricular block   . Aortic stenosis     Mean gradient 23  6/13  . Pacemaker - Medtronic     DOI-7/13      Past Surgical History  Procedure Laterality Date  . Appendectomy      in for   No chief complaint on file.    HPI  Lucas Khan  is a 79 y.o. male, with history of dementia diagnosed 8 years ago, Mobitz type 2 second degree heart block requiring pacemaker placement, aortic stenosis, hypertension, dyslipidemia , who lives with his wife and was doing well with some gradually progressive memory loss has been brought in by wife after he started being more confused than his normal self for the last few days, early this morning he had a fall after he was coming from his bathroom, she then brought him to the ER where a head CT was done, CBC BMP were obtained with mild leukocytosis and I was called to admit the patient.    Patient himself denies any headache, no back or chest pain, no down her pain, denies any hip discomfort, he denies any fever chills or dysuria, denies any chest, possibly mild cough.    Review of Systems  essentially negative review of systems right now  In addition to the HPI above,   No Fever-chills, No Headache, No changes with Vision or hearing, No problems swallowing food or Liquids, No Chest pain, Cough or Shortness of Breath, No Abdominal pain, No Nausea or Vommitting, Bowel movements are regular, No Blood in stool or Urine, No dysuria, No new skin rashes or bruises, No new joints pains-aches,   No new weakness, tingling, numbness in any extremity, No recent weight gain or loss, No polyuria, polydypsia or polyphagia, No significant Mental Stressors.  A full 10 point Review of Systems was done, except as stated above, all other Review of Systems were negative.   Social History History  Substance Use Topics  . Smoking status: Former Smoker    Quit date: 01/17/2010  . Smokeless tobacco: Not on file  . Alcohol Use: No      Family History Family History  Problem Relation Age of Onset  . Memory loss        Prior to Admission medications   Medication Sig Start Date End Date Taking? Authorizing Provider  amLODipine (NORVASC) 5 MG tablet Take 1 tablet (5 mg total) by mouth daily. 09/20/12  Yes Stacie Glaze, MD  calcium carbonate (OS-CAL) 600 MG TABS Take 600 mg by mouth daily.   Yes Historical Provider, MD  cyanocobalamin 500 MCG tablet Take  500 mcg by mouth daily.   Yes Historical Provider, MD  fish oil-omega-3 fatty acids 1000 MG capsule Take 3 g by mouth daily.    Yes Historical Provider, MD  Ginkgo Biloba 40 MG TABS Take 1 tablet by mouth daily.   Yes Historical Provider, MD  PHOSPHATIDYLSERINE PO Take 1 tablet by mouth daily.    Yes Historical Provider, MD  saw palmetto 500 MG capsule Take 500 mg by mouth daily.   Yes Historical Provider, MD    Allergies  Allergen Reactions  . Amoxicillin     REACTION: unspecified  . Statins     REACTION: unspecified    Physical Exam  Vitals  Blood pressure 152/59, pulse 74, temperature 99.5 F (37.5 C), temperature source Rectal, resp. rate 22, SpO2 99.00%.   1. General pleasant elderly white male lying in bed in NAD,    2. Normal affect and insight, Not Suicidal or Homicidal, Awake, pleasantly confused, Oriented X 1  3. No F.N deficits, ALL C.Nerves Intact, Strength 5/5 all 4 extremities, Sensation intact all 4 extremities, Plantars down going.  4. Ears and Eyes appear Normal, Conjunctivae clear, PERRLA. Moist Oral  Mucosa.  5. Supple Neck, No JVD, No cervical lymphadenopathy appriciated, No Carotid Bruits.  6. Symmetrical Chest wall movement, Good air movement bilaterally, CTAB.  7. RRR, No Gallops, Rubs , positive systolic murmur, No Parasternal Heave.  8. Positive Bowel Sounds, Abdomen Soft, Non tender, No organomegaly appriciated,No rebound -guarding or rigidity.  9.  No Cyanosis, Normal Skin Turgor, No Skin Rash or Bruise.  10. Good muscle tone,  joints appear normal , no effusions, Normal ROM.  11. No Palpable Lymph Nodes in Neck or Axillae     Data Review  CBC  Recent Labs Lab 03/09/13 1540  WBC 12.7*  HGB 9.4*  HCT 29.2*  PLT 283  MCV 72.3*  MCH 23.3*  MCHC 32.2  RDW 16.8*  LYMPHSABS 1.5  MONOABS 2.7*  EOSABS 0.0  BASOSABS 0.0   ------------------------------------------------------------------------------------------------------------------  Chemistries   Recent Labs Lab 03/09/13 1540 03/09/13 1612  NA 135  --   K 4.1  --   CL 102  --   CO2 23  --   GLUCOSE 125*  --   BUN 13  --   CREATININE 0.64  --   CALCIUM 8.8  --   AST  --  26  ALT  --  17  ALKPHOS  --  113  BILITOT  --  0.7   ------------------------------------------------------------------------------------------------------------------ CrCl is unknown because both a height and weight (above a minimum accepted value) are required for this calculation. ------------------------------------------------------------------------------------------------------------------ No results found for this basename: TSH, T4TOTAL, FREET3, T3FREE, THYROIDAB,  in the last 72 hours   Coagulation profile No results found for this basename: INR, PROTIME,  in the last 168 hours ------------------------------------------------------------------------------------------------------------------- No results found for this basename: DDIMER,  in the last 72  hours -------------------------------------------------------------------------------------------------------------------  Cardiac Enzymes No results found for this basename: CK, CKMB, TROPONINI, MYOGLOBIN,  in the last 168 hours ------------------------------------------------------------------------------------------------------------------ No components found with this basename: POCBNP,    ---------------------------------------------------------------------------------------------------------------  Urinalysis    Component Value Date/Time   COLORURINE YELLOW 03/09/2013 1524   APPEARANCEUR CLEAR 03/09/2013 1524   LABSPEC 1.024 03/09/2013 1524   PHURINE 6.5 03/09/2013 1524   GLUCOSEU NEGATIVE 03/09/2013 1524   HGBUR NEGATIVE 03/09/2013 1524   HGBUR negative 10/14/2010 0934   BILIRUBINUR NEGATIVE 03/09/2013 1524   BILIRUBINUR 1+ 11/08/2012 1034   KETONESUR 15* 03/09/2013 1524  PROTEINUR NEGATIVE 03/09/2013 1524   UROBILINOGEN 1.0 03/09/2013 1524   UROBILINOGEN 1.0 11/08/2012 1034   NITRITE NEGATIVE 03/09/2013 1524   NITRITE n 11/08/2012 1034   LEUKOCYTESUR NEGATIVE 03/09/2013 1524    ----------------------------------------------------------------------------------------------------------------  Imaging results:   Ct Head Wo Contrast  03/09/2013   *RADIOLOGY REPORT*  Clinical Data: Altered mental status  CT HEAD WITHOUT CONTRAST  Technique:  Contiguous axial images were obtained from the base of the skull through the vertex without contrast.  Comparison: None.  Findings: There is diffuse patchy low density throughout the subcortical and periventricular white matter consistent with chronic small vessel ischemic change.  There is prominence of the sulci and ventricles consistent with brain atrophy.  There is no evidence for acute brain infarct, hemorrhage or mass.  There is mild mucosal thickening involving the right maxillary sinus.  The mastoid air cells are clear.  The skull is intact.   IMPRESSION:  1.  Small vessel gas beneath disease and brain atrophy. 2.  No acute intracranial abnormalities noted. 3.  Right maxillary sinus mucosal thickening.   Original Report Authenticated By: Signa Kell, M.D.    My personal review of EKG: Rhythm demand pace, , RBBB, LAFB non specific ST changes   Echo 03-2012  - Left ventricle: The cavity size was normal. Wall thickness was increased in a pattern of mild LVH. Systolic function was normal. The estimated ejection fraction was in the range of 60% to 65%. Wall motion was normal; there were no regional wall motion abnormalities. Doppler parameters are consistent with abnormal left ventricular relaxation (grade 1 diastolic dysfunction). - Aortic valve: There was mild to moderate stenosis. Trivial regurgitation. Mean gradient: 23mm Hg (S). Peak gradient: 45mm Hg (S). - Left atrium: The atrium was mildly dilated.    Assessment & Plan    1. Fall, gradually declining functional status in a patient with dementia for the last 8 years - he has no subjective complaints right now, his head CT has been negative, MRI cannot be obtained due to pacemaker, no focal deficits on exam, he could have a URI, will obtain a chest x-ray, will place him on azithromycin as he has leukocytosis and mild cough, will give him gentle IV fluids, will keep him on a telemetry Bed to rule out dysrhythmia, cycle troponin, will have PT evaluate him. Wife explained and informed that he is at risk for delirium, fall and aspiration precautions. Will check a B12 level.    2. History of Mobitz type 2 second degree AV block with pacemaker. No acute issues monitor on telemetry, will place him on aspirin for now, cycle troponins, he follows with [facility] cardiology.    3. Possible URI. Obtain two-view chest x-ray, azithromycin for now.    4. Hypertension. Has been on Norvasc, wife thinks his medical issues have gotten worse since he has been on Norvasc, we will hold  Norvasc and place him on low-dose beta blocker.    5. Fall. He landed on his back per wife, will check L-spine x-ray. PT to evaluate, fall precautions.    6. Moderate aortic stenosis with systolic murmur, chronic diastolic dysfunction with EF of 60% per echo gram of 04/07/2012. No acute issues monitor clinically, he is compensated from CHF standpoint.     DVT Prophylaxis Heparin    AM Labs Ordered, also please review Full Orders  Family Communication: Admission, patients condition and plan of care including tests being ordered have been discussed with the patient and wife who indicate understanding  and agree with the plan and Code Status.  Code Status DNR-I  Likely DC to  TBD  Time spent in minutes :35  Condition Marinell Blight K M.D on 03/09/2013 at 5:39 PM  Between 7am to 7pm - Pager - [phone]  After 7pm go to [email or web address] - password [email or web address]  And look for the night coverage person covering me after hours  [facility] Office  [phone]

## 2013-03-09 NOTE — ED Notes (Signed)
Patient transported to X-ray 

## 2013-03-09 NOTE — ED Notes (Signed)
Patient transported to CT 

## 2013-03-09 NOTE — ED Notes (Signed)
[id number]<WG> Expected date:<BR> Expected time:<BR> Means of arrival:<BR> Comments:<BR> ems- elderly, possible UTI

## 2013-03-09 NOTE — ED Notes (Signed)
Per EMS pt coming from home with c/o generalized weakness that is getting progressively worse. Per EMS pt's wife reports pt is loosing control of his bladder and bowel functions. EMS reports pt has hx of early dementia.

## 2013-03-10 LAB — CBC
HCT: 28.9 % — ABNORMAL LOW (ref 39.0–52.0)
Hemoglobin: 9.2 g/dL — ABNORMAL LOW (ref 13.0–17.0)
MCHC: 31.8 g/dL (ref 30.0–36.0)
RBC: 3.93 MIL/uL — ABNORMAL LOW (ref 4.22–5.81)

## 2013-03-10 LAB — BASIC METABOLIC PANEL
BUN: 15 mg/dL (ref 6–23)
Chloride: 102 mEq/L (ref 96–112)
GFR calc Af Amer: >90 mL/min (ref >90)
Potassium: 3.9 mEq/L (ref 3.5–5.1)
Sodium: 136 mEq/L (ref 135–145)

## 2013-03-10 LAB — HEMOGLOBIN A1C
Hgb A1c MFr Bld: 6.3 % — ABNORMAL HIGH (ref <5.7)
Mean Plasma Glucose: 134 mg/dL — ABNORMAL HIGH (ref <117)

## 2013-03-10 LAB — VITAMIN B12: Vitamin B-12: 987 pg/mL — ABNORMAL HIGH (ref 211–911)

## 2013-03-10 LAB — GLUCOSE, CAPILLARY: Glucose-Capillary: 102 mg/dL — ABNORMAL HIGH (ref 70–99)

## 2013-03-10 LAB — MAGNESIUM: Magnesium: 2.4 mg/dL (ref 1.5–2.5)

## 2013-03-10 LAB — TSH: TSH: 1.437 u[IU]/mL (ref 0.350–4.500)

## 2013-03-10 MED ORDER — AZITHROMYCIN 250 MG PO TABS
250.0000 mg | ORAL_TABLET | Freq: Every day | ORAL | Status: DC
  Administered 2013-03-10 – 2013-03-11 (×2): 250 mg via ORAL
  Filled 2013-03-10 (×2): qty 1

## 2013-03-10 NOTE — Progress Notes (Signed)
Nutrition Brief Note  Patient identified on the Malnutrition Screening Tool (MST) Report  Body mass index is 28.62 kg/(m^2). Patient meets criteria for Overweight based on current BMI. Pt's wife reports that pt usually weighs 174 lbs; pt is 3 lbs above usual body weight.   Current diet order is Heart Healthy, 2 grams Sodium and 2000 ml fluid restriction. Pt's wife brought pt breakfast and reports pt ate some grapefruit, yogurt, and banana. Per wife pt has had a decreased appetite for the past 2 days but, was eating very well previously with 2-3 meals daily. Wife reports that pt eats healthy at home but, eats out occasionally too; pt has no difficulty chewing or swallowing food. Labs and medications reviewed.   No nutrition interventions warranted at this time. If nutrition issues arise, please consult RD.   Ian Malkin RD, LDN Inpatient Clinical Dietitian Pager: [phone] After Hours Pager: [phone]

## 2013-03-10 NOTE — Evaluation (Signed)
Physical Therapy Evaluation Patient Details Name: Lucas Khan MRN: [id number] DOB: [birth year] Today's Date: 03/10/2013 Time: 4098-1191 PT Time Calculation (min): 23 min  PT Assessment / Plan / Recommendation Clinical Impression  79 y/o male admitted 79 77/22/14 after fall at home. Pt has L3 compresssion fx of undeterminant ag. Pt has a LSO ordered for OOB. Pt on tolerated sitting at the edge. Appeared to jhave increase in Pain. Pt will benefit fro  PT while in acute care. Recommend SNF for rehab.    PT Assessment  Patient needs continued PT services    Follow Up Recommendations  SNF    Does the patient have the potential to tolerate intense rehabilitation      Barriers to Discharge Decreased caregiver support      Equipment Recommendations  None recommended by PT    Recommendations for Other Services     Frequency Min 3X/week    Precautions / Restrictions Precautions Precautions: Fall Required Braces or Orthoses: Other Brace/Splint Other Brace/Splint: LSO   Pertinent Vitals/Pain Did not rate but moans and grimaces.     Mobility  Bed Mobility Bed Mobility: Rolling Right;Rolling Left;Left Sidelying to Sit;Sit to Sidelying Left Rolling Right: 4: Min guard;With rail Rolling Left: 4: Min guard;With rail Left Sidelying to Sit: 2: Max assist Sit to Sidelying Left: 2: Max assist Details for Bed Mobility Assistance: pt did not properly go to side prior to lying down, went straight to his back which appeared to be painful as pt moaned out. Transfers Transfers: Sit to Stand;Stand to Sit Sit to Stand: 1: +2 Total assist;From bed Stand to Sit: To bed;1: +2 Total assist Transfer via Lift Equipment: Stedy Details for Transfer Assistance: pt brief;y stood, not straight , at [employer] with 2 persons. Pt appeared in pain and moved himself back to bed and to supine. Ambulation/Gait Ambulation/Gait Assistance: Not tested (comment)    Exercises     PT Diagnosis: Generalized weakness;Acute  pain  PT Problem List: Decreased strength;Decreased range of motion;Decreased activity tolerance;Decreased balance;Decreased mobility;Decreased cognition;Decreased knowledge of precautions;Decreased safety awareness;Pain PT Treatment Interventions: DME instruction;Gait training;Functional mobility training;Therapeutic activities;Therapeutic exercise;Patient/family education   PT Goals Acute Rehab PT Goals PT Goal Formulation: With family Time For Goal Achievement: 03/24/13 Potential to Achieve Goals: Good Pt will go Supine/Side to Sit: with supervision PT Goal: Supine/Side to Sit - Progress: Goal set today Pt will go Sit to Supine/Side: with supervision PT Goal: Sit to Supine/Side - Progress: Goal set today Pt will go Sit to Stand: with supervision PT Goal: Sit to Stand - Progress: Goal set today Pt will go Stand to Sit: with supervision PT Goal: Stand to Sit - Progress: Goal set today Pt will Transfer Bed to Chair/Chair to Bed: with supervision PT Transfer Goal: Bed to Chair/Chair to Bed - Progress: Goal set today Pt will Ambulate: 51 - 150 feet;with rolling walker PT Goal: Ambulate - Progress: Goal set today  Visit Information  Last PT Received On: 03/10/13 Assistance Needed: +2    Subjective Data  Subjective: Oh no.  Patient Stated Goal: Per wife to get back to walking.   Prior Functioning  Home Living Lives With: Spouse Available Help at Discharge: Family Type of Home: House Home Layout: Two level Alternate Level Stairs-Number of Steps: flight. Prior Function Able to Take Stairs?: Yes Driving: Yes Comments: per wife about a week ago  Communication Communication: No difficulties    Cognition  Cognition Arousal/Alertness: Awake/alert Behavior During Therapy: Restless Overall Cognitive Status: Difficult to assess Difficult  to assess due to:  (pt followed 1 step commands.)    Extremity/Trunk Assessment Right Upper Extremity Assessment RUE ROM/Strength/Tone: [facility] for  tasks assessed Left Upper Extremity Assessment LUE ROM/Strength/Tone: WFL for tasks assessed Right Lower Extremity Assessment RLE ROM/Strength/Tone: Unable to fully assess Left Lower Extremity Assessment LLE ROM/Strength/Tone: Unable to fully assess Trunk Assessment Trunk Assessment: Normal   Balance Balance Balance Assessed: Yes Static Sitting Balance Static Sitting - Balance Support: Bilateral upper extremity supported Static Sitting - Level of Assistance: 4: Min assist Static Sitting - Comment/# of Minutes: pt tends to list  to L.  End of Session PT - End of Session Activity Tolerance: Patient limited by pain Patient left: with bed alarm set;in bed;with call bell/phone within reach Nurse Communication: Mobility status  GP Functional Assessment Tool Used: clinical judgement. Functional Limitation: Mobility: Walking and moving around Mobility: Walking and Moving Around Current Status [phone]): At least 60 percent but less than 80 percent impaired, limited or restricted Mobility: Walking and Moving Around Goal Status [phone]): At least 1 percent but less than 20 percent impaired, limited or restricted   Rada Hay 03/10/2013, 2:24 PM  Blanchard Kelch PT [phone]

## 2013-03-10 NOTE — Progress Notes (Signed)
CSW provided spouse with bed offers. She requests [facility]. [employer] auth needs to be obtained. Spouse to complete paperwork at 330pm at [facility].  Shafiq Larch C. Dequon Schnebly MSW, LCSW [phone]

## 2013-03-10 NOTE — ED Provider Notes (Signed)
History     CSN: [id number]  Arrival date & time 03/09/13  1407   First MD Initiated Contact with Patient 03/09/13 1449      No chief complaint on file.   (Consider location/radiation/quality/duration/timing/severity/associated sxs/prior treatment) HPI Patient presents to the emergency department with weakness, and altered mental status for the last 3 days.  Patient, apparently fell around 1 AM this morning and was found by his wife.  She states she was unable to get him up off of the floor.  The wife is giving me on the history for the patient.  Patient has not had any vomiting, diarrhea, fever, cough, or syncope.  Wife states, that patient has become incontinent of urine as well.  She also, states, that the patient not able to walk, which is abnormal for him. Past Medical History  Diagnosis Date  . Hyperlipidemia   . Hypertension   . Memory loss   . Mobitz type 2 second degree atrioventricular block   . Aortic stenosis     Mean gradient 23  6/13  . Pacemaker - [employer]     DOI-7/13    Past Surgical History  Procedure Laterality Date  . Appendectomy      Family History  Problem Relation Age of Onset  . Memory loss      History  Substance Use Topics  . Smoking status: Former Smoker    Types: Pipe    Quit date: 01/17/2010  . Smokeless tobacco: Never Used  . Alcohol Use: No      Review of Systems Level V caveat applies due to altered mental status  Allergies  Penicillins; Amoxicillin; and Statins  Home Medications  No current outpatient prescriptions on file.  BP 139/51  Pulse 61  Temp(Src) 98.1 F (36.7 C) (Oral)  Resp 20  Ht 5\' 6"  (1.676 m)  Wt 177 lb 4 oz (80.4 kg)  BMI 28.62 kg/m2  SpO2 96%  Physical Exam  Nursing note and vitals reviewed. Constitutional: He appears well-developed and well-nourished. No distress.  HENT:  Head: Normocephalic and atraumatic.  Mouth/Throat: Oropharynx is clear and moist.  Eyes: Pupils are equal, round, and  reactive to light.  Neck: Trachea normal and normal range of motion. Neck supple. No rigidity. Normal range of motion present.  Cardiovascular: Normal rate, regular rhythm and normal heart sounds.  Exam reveals no gallop and no friction rub.   No murmur heard. Pulmonary/Chest: Effort normal and breath sounds normal. No respiratory distress.  Lymphadenopathy:    He has no cervical adenopathy.  Neurological: He is alert. He exhibits normal muscle tone. Coordination normal.  Patient is able to maintain that he is at [facility] cone not able to tell me the day the month,or the year. patient has normal strength in all extremities  Skin: Skin is warm and dry. No rash noted.    ED Course  Procedures (including critical care time)  Labs Reviewed  CBC WITH DIFFERENTIAL - Abnormal; Notable for the following:    WBC 12.7 (*)    RBC 4.04 (*)    Hemoglobin 9.4 (*)    HCT 29.2 (*)    MCV 72.3 (*)    MCH 23.3 (*)    RDW 16.8 (*)    Neutro Abs 8.5 (*)    Lymphocytes Relative 11 (*)    Monocytes Relative 21 (*)    Monocytes Absolute 2.7 (*)    All other components within normal limits  BASIC METABOLIC PANEL - Abnormal; Notable for the following:  Glucose, Bld 125 (*)    All other components within normal limits  URINALYSIS, ROUTINE W REFLEX MICROSCOPIC - Abnormal; Notable for the following:    Ketones, ur 15 (*)    All other components within normal limits  HEPATIC FUNCTION PANEL - Abnormal; Notable for the following:    Albumin 2.6 (*)    All other components within normal limits  VITAMIN B12 - Abnormal; Notable for the following:    Vitamin B-12 987 (*)    All other components within normal limits  BASIC METABOLIC PANEL - Abnormal; Notable for the following:    Glucose, Bld 106 (*)    All other components within normal limits  CBC - Abnormal; Notable for the following:    WBC 11.6 (*)    RBC 3.93 (*)    Hemoglobin 9.2 (*)    HCT 28.9 (*)    MCV 73.5 (*)    MCH 23.4 (*)    RDW 17.0  (*)    All other components within normal limits  HEMOGLOBIN A1C - Abnormal; Notable for the following:    Hemoglobin A1C 6.3 (*)    Mean Plasma Glucose 134 (*)    All other components within normal limits  GLUCOSE, CAPILLARY - Abnormal; Notable for the following:    Glucose-Capillary 102 (*)    All other components within normal limits  PROTIME-INR  TSH  TROPONIN I  TROPONIN I  TROPONIN I  MAGNESIUM  VITAMIN B12  TROPONIN I  CG4 I-STAT (LACTIC ACID)   Dg Chest 2 View  03/09/2013   *RADIOLOGY REPORT*  Clinical Data: Confusion, weakness  CHEST - 2 VIEW  Comparison: 04/19/2012  Findings: Low lung volumes with vascular crowding.  No focal consolidation. No pleural effusion or pneumothorax.  The heart is top normal in size for inspiration.  Left subclavian pacemaker.  Visualized osseous structures are within normal limits.  IMPRESSION: No evidence of acute cardiopulmonary disease.   Original Report Authenticated By: Charline Bills, M.D.   Dg Lumbar Spine Complete  03/09/2013   *RADIOLOGY REPORT*  Clinical Data: Low back pain post fall  LUMBAR SPINE - COMPLETE 4+ VIEW  Comparison: None  Findings: Osseous demineralization. Disc space narrowing L4-L5. Age indeterminate superior endplate compression fracture of L3 with 10-20% anterior height loss. No additional fracture, subluxation or bone destruction. No spondylolysis. Scattered atherosclerotic calcifications. SI joints symmetric.  IMPRESSION: Age indeterminate superior plate compression fracture of L3 with 10- 20% anterior height loss. Osseous demineralization.   Original Report Authenticated By: Ulyses Southward, M.D.   Ct Head Wo Contrast  03/09/2013   *RADIOLOGY REPORT*  Clinical Data: Altered mental status  CT HEAD WITHOUT CONTRAST  Technique:  Contiguous axial images were obtained from the base of the skull through the vertex without contrast.  Comparison: None.  Findings: There is diffuse patchy low density throughout the subcortical and  periventricular white matter consistent with chronic small vessel ischemic change.  There is prominence of the sulci and ventricles consistent with brain atrophy.  There is no evidence for acute brain infarct, hemorrhage or mass.  There is mild mucosal thickening involving the right maxillary sinus.  The mastoid air cells are clear.  The skull is intact.  IMPRESSION:  1.  Small vessel gas beneath disease and brain atrophy. 2.  No acute intracranial abnormalities noted. 3.  Right maxillary sinus mucosal thickening.   Original Report Authenticated By: Signa Kell, M.D.     1. Weakness   2. Hyperlipidemia   3. Hypertension  4. Aortic stenosis   5. Memory loss   6. Mobitz type 2 second degree atrioventricular block    Patient will need admission to the hospital for further evaluation of a sudden onset of worsening altered mental status and weakness.  I spoke with the [facility], who will be in to see the patient for admission.   MDM  MDM Reviewed: nursing note and vitals Interpretation: labs, x-ray and CT scan Consults: admitting MD   Date: 03/10/2013  Rate: 74  Rhythm: Electronically paced  QRS Axis: normal  Intervals: normal  ST/T Wave abnormalities: normal  Conduction Disutrbances:none  Narrative Interpretation:   Old EKG Reviewed: unchanged            Carlyle Dolly, PA-C 03/10/13 1552

## 2013-03-10 NOTE — Clinical Social Work Psychosocial (Unsigned)
     Clinical Social Work Department BRIEF PSYCHOSOCIAL ASSESSMENT 03/10/2013  Patient:  Lucas Khan, Lucas Khan     Account Number:  [id number]     Admit date:  03/09/2013  Clinical Social Worker:  Hattie Perch  Date/Time:  03/10/2013 12:00 M  Referred by:  Physician  Date Referred:  03/10/2013 Referred for  SNF Placement   Other Referral:   Interview type:  Family Other interview type:    PSYCHOSOCIAL DATA Living Status:  FAMILY Admitted from facility:   Level of care:   Primary support name:  Wahlert,Mireille Primary support relationship to patient:  SPOUSE Degree of support available:   good    CURRENT CONCERNS Current Concerns  Post-Acute Placement   Other Concerns:    SOCIAL WORK ASSESSMENT / PLAN CSW met with patient and spouse at bedside. patient is confused and unable to participate in assessment. patient in need of snf placement. CSW discussed snf process and [facility] with spouse. she is agreeable to process and would like something close to her if possible.   Assessment/plan status:   Other assessment/ plan:   Information/referral to community resources:    PATIENTS/FAMILYS RESPONSE TO PLAN OF CARE: spouse is anxious about snf process but is agreeable to CSW faxing patient out and recieving bed offers.

## 2013-03-10 NOTE — Progress Notes (Signed)
[facility]                                                                                Patient Demographics  Calven Gilkes, is a 79 y.o. male, DOB - Oct 12, 1931, BJY:[id number], YQM:[id number]  Admit date - 03/09/2013  Admitting Physician Leroy Sea, MD  Outpatient Primary MD for the patient is Carrie Mew, MD  LOS - 1   No chief complaint on file.       Assessment & Plan     1. Fall, gradually declining functional status in a patient with dementia for the last 8 years - he has no subjective complaints right now, his head CT has been negative, MRI cannot be obtained due to pacemaker, no focal deficits on exam, he could have mild URI, -ve  chest x-ray and UA, continue on azithromycin, gentle IV fluids, will keep him on a telemetry Bed to rule out dysrhythmia, -ve  troponins, will have PT evaluate him. Wife explained and informed that he is at risk for delirium, fall and aspiration precautions. Stable B12 level. This is likely suggestive of gradual but definite decline of his chronic underlying dementia.     2. History of Mobitz type 2 second degree AV block with pacemaker. No acute issues monitor on telemetry, continue on aspirin for now, -ve  troponins, he follows with [facility] cardiology.      3. Possible URI. stable chest x-ray, complete azithromycin course.      4. Hypertension. Has been on Norvasc, wife thinks his medical issues have gotten worse since he has been on Norvasc, we will hold Norvasc and place him on low-dose beta blocker. Stable.     5. Fall. He landed on his back per wife, will check L-spine x-ray. PT to evaluate, fall precautions.      6. Moderate aortic stenosis with systolic murmur, chronic diastolic dysfunction with EF of 60% per echo gram of 04/07/2012. No acute issues monitor clinically, he is compensated from CHF standpoint.      7. L-spine fracture noted on x-ray upon admission. Denies any back pain, case was  discussed with Dr. Yetta Barre neurosurgeon on call who reviewed his images, plan is to provide the patient  L-spine brace, he will follow outpatient with Dr. Yetta Barre post discharge after a week, he remains pain-free with no lower extremity weakness, no bowel bladder incontinence.      Code Status: DNR  Family Communication: wife  Disposition Plan: TBD   Procedures CT head, L spine Xray   Consults  Dr Yetta Barre N.Surg over the phone   DVT Prophylaxis Heparin   Lab Results  Component Value Date   PLT 271 03/10/2013    Medications  Scheduled Meds: . aspirin  81 mg Oral Daily  . azithromycin  250 mg Intravenous Q24H  . calcium carbonate  1 tablet Oral Q breakfast  . cyanocobalamin  500 mcg Oral Daily  . heparin  5,000 Units Subcutaneous Q8H  . metoprolol tartrate  50 mg Oral BID  . omega-3 acid ethyl esters  3 g Oral Daily  . sodium chloride  3 mL Intravenous Q12H   Continuous Infusions: . 0.9 % NaCl with KCl 20  mEq / L 75 mL/hr at 03/10/13 0539   PRN Meds:.albuterol, guaiFENesin-dextromethorphan, haloperidol lactate, HYDROcodone-acetaminophen, ondansetron (ZOFRAN) IV, ondansetron, polyethylene glycol  Antibiotics    Anti-infectives   Start     Dose/Rate Route Frequency Ordered Stop   03/10/13 1800  azithromycin (ZITHROMAX) 250 mg in dextrose 5 % 125 mL IVPB     250 mg 125 mL/hr over 60 Minutes Intravenous Every 24 hours 03/09/13 1831     03/09/13 2000  azithromycin (ZITHROMAX) 500 mg in dextrose 5 % 250 mL IVPB     500 mg 250 mL/hr over 60 Minutes Intravenous  Once 03/09/13 1831 03/09/13 2116       Time Spent in minutes   35   Chibuikem Thang K M.D on 03/10/2013 at 9:44 AM  Between 7am to 7pm - Pager - [phone]  After 7pm go to [email or web address] - password [email or web address]  And look for the night coverage person covering for me after hours  [facility] Office  [phone]    Subjective:   Zackery Barefoot today has, No headache, No chest pain, No abdominal pain  - No Nausea, No new weakness tingling or numbness, No Cough - SOB. No back pain.   Objective:   Filed Vitals:   03/09/13 1844 03/09/13 2037 03/10/13 0500 03/10/13 0538  BP: 144/65 112/40  149/62  Pulse: 79 91  91  Temp: 97.6 F (36.4 C) 98.5 F (36.9 C)  98.3 F (36.8 C)  TempSrc: Oral Oral  Oral  Resp: 18 18  18   Height: 5\' 6"  (1.676 m)     Weight: 77.111 kg (170 lb)  80.4 kg (177 lb 4 oz)   SpO2: 100% 95%  95%    Wt Readings from Last 3 Encounters:  03/10/13 80.4 kg (177 lb 4 oz)  11/21/12 81.647 kg (180 lb)  08/09/12 78.926 kg (174 lb)     Intake/Output Summary (Last 24 hours) at 03/10/13 0944 Last data filed at 03/10/13 0830  Gross per 24 hour  Intake 703.75 ml  Output    475 ml  Net 228.75 ml    Exam Awake Alert, Oriented X 1, No new F.N deficits, Normal affect .AT,PERRAL Supple Neck,No JVD, No cervical lymphadenopathy appriciated.  Symmetrical Chest wall movement, Good air movement bilaterally, CTAB RRR,No Gallops,Rubs , +ve systolic Murmur, No Parasternal Heave +ve B.Sounds, Abd Soft, Non tender, No organomegaly appriciated, No rebound - guarding or rigidity. No spine tenderness. No Cyanosis, Clubbing or edema, No new Rash or bruise     Data Review   Micro Results No results found for this or any previous visit (from the past 240 hour(s)).  Radiology Reports Dg Chest 2 View  03/09/2013   *RADIOLOGY REPORT*  Clinical Data: Confusion, weakness  CHEST - 2 VIEW  Comparison: 04/19/2012  Findings: Low lung volumes with vascular crowding.  No focal consolidation. No pleural effusion or pneumothorax.  The heart is top normal in size for inspiration.  Left subclavian pacemaker.  Visualized osseous structures are within normal limits.  IMPRESSION: No evidence of acute cardiopulmonary disease.   Original Report Authenticated By: Charline Bills, M.D.   Dg Lumbar Spine Complete  03/09/2013   *RADIOLOGY REPORT*  Clinical Data: Low back pain post fall  LUMBAR SPINE  - COMPLETE 4+ VIEW  Comparison: None  Findings: Osseous demineralization. Disc space narrowing L4-L5. Age indeterminate superior endplate compression fracture of L3 with 10-20% anterior height loss. No additional fracture, subluxation or bone destruction. No spondylolysis. Scattered atherosclerotic calcifications. SI joints symmetric.  IMPRESSION: Age indeterminate superior plate compression fracture of L3 with 10- 20% anterior height loss. Osseous demineralization.   Original Report Authenticated By: Ulyses Southward, M.D.   Ct Head Wo Contrast  03/09/2013   *RADIOLOGY REPORT*  Clinical Data: Altered mental status  CT HEAD WITHOUT CONTRAST  Technique:  Contiguous axial images were obtained from the base of the skull through the vertex without contrast.  Comparison: None.  Findings: There is diffuse patchy low density throughout the subcortical and periventricular white matter consistent with chronic small vessel ischemic change.  There is prominence of the sulci and ventricles consistent with brain atrophy.  There is no evidence for acute brain infarct, hemorrhage or mass.  There is mild mucosal thickening involving the right maxillary sinus.  The mastoid air cells are clear.  The skull is intact.  IMPRESSION:  1.  Small vessel gas beneath disease and brain atrophy. 2.  No acute intracranial abnormalities noted. 3.  Right maxillary sinus mucosal thickening.   Original Report Authenticated By: Signa Kell, M.D.    CBC  Recent Labs Lab 03/09/13 1540 03/10/13 0620  WBC 12.7* 11.6*  HGB 9.4* 9.2*  HCT 29.2* 28.9*  PLT 283 271  MCV 72.3* 73.5*  MCH 23.3* 23.4*  MCHC 32.2 31.8  RDW 16.8* 17.0*  LYMPHSABS 1.5  --   MONOABS 2.7*  --   EOSABS 0.0  --   BASOSABS 0.0  --     Chemistries   Recent Labs Lab 03/09/13 1540 03/09/13 1612 03/10/13 0620  NA 135  --  136  K 4.1  --  3.9  CL 102  --  102  CO2 23  --  25  GLUCOSE 125*  --  106*  BUN 13  --  15  CREATININE 0.64  --  0.60  CALCIUM 8.8   --  8.8  MG  --   --  2.4  AST  --  26  --   ALT  --  17  --   ALKPHOS  --  113  --   BILITOT  --  0.7  --    ------------------------------------------------------------------------------------------------------------------ estimated creatinine clearance is 73.3 ml/min (by C-G formula based on Cr of 0.6). ------------------------------------------------------------------------------------------------------------------  Recent Labs  03/09/13 1909  HGBA1C 6.3*   ------------------------------------------------------------------------------------------------------------------ No results found for this basename: CHOL, HDL, LDLCALC, TRIG, CHOLHDL, LDLDIRECT,  in the last 72 hours ------------------------------------------------------------------------------------------------------------------  Recent Labs  03/09/13 1909  TSH 1.437   ------------------------------------------------------------------------------------------------------------------  Recent Labs  03/09/13 1800 03/09/13 1909  VITAMINB12 987* 829    Coagulation profile  Recent Labs Lab 03/09/13 1909  INR 1.05    No results found for this basename: DDIMER,  in the last 72 hours  Cardiac Enzymes  Recent Labs Lab 03/09/13 1909 03/10/13 0042 03/10/13 0620  TROPONINI <0.30 <0.30 <0.30   ------------------------------------------------------------------------------------------------------------------ No components found with this basename: POCBNP,

## 2013-03-11 MED ORDER — AZITHROMYCIN 250 MG PO TABS: ORAL_TABLET | ORAL | Status: DC

## 2013-03-11 MED ORDER — METOPROLOL TARTRATE 50 MG PO TABS: 50.0000 mg | ORAL_TABLET | Freq: Two times a day (BID) | ORAL | Status: DC

## 2013-03-11 MED ORDER — HYDRALAZINE HCL 20 MG/ML IJ SOLN
10.0000 mg | Freq: Three times a day (TID) | INTRAMUSCULAR | Status: DC
  Administered 2013-03-11: 10 mg via INTRAVENOUS
  Filled 2013-03-11: qty 1

## 2013-03-11 NOTE — Progress Notes (Signed)
[facility]                                                                                Patient Demographics  Lucas Khan, is a 79 y.o. male, DOB - 12/08/1930, ZOX:[id number], WJX:[id number]  Admit date - 03/09/2013  Admitting Physician Leroy Sea, MD  Outpatient Primary MD for the patient is Carrie Mew, MD  LOS - 2   No chief complaint on file.       Assessment & Plan     1. Fall, gradually declining functional status in a patient with dementia for the last 8 years - he has no subjective complaints right now, his head CT has been negative, MRI cannot be obtained due to pacemaker, no focal deficits on exam, he could have mild URI, -ve  chest x-ray and UA, continue on azithromycin switched to PO, post gentle IV fluids, will keep him on a telemetry Bed to rule out dysrhythmia, -ve  troponins, PT following, will need SNF, . Wife explained and informed that he is at risk for delirium, fall and aspiration precautions. Stable B12 level.    This is likely suggestive of gradual but definite decline of his chronic underlying dementia.     2. History of Mobitz type 2 second degree AV block with pacemaker. No acute issues monitor on telemetry, continue on aspirin for now, -ve  troponins, he follows with [facility] cardiology.      3. Possible URI. stable chest x-ray, complete azithromycin course.      4. Hypertension. Has been on Norvasc, wife thinks his medical issues have gotten worse since he has been on Norvasc, Norvasc has been stopped, is on better blocker will add hydralazine scheduled for better control, monitor blood pressure.    5. Moderate aortic stenosis with systolic murmur, chronic diastolic dysfunction with EF of 60% per echo gram of 04/07/2012. No acute issues monitor clinically, he is compensated from CHF standpoint.      6. L-spine fracture noted on x-ray upon admission. Denies any back pain, case was discussed with Dr. Yetta Barre neurosurgeon  on call who reviewed his images, plan is to provide the patient  L-spine brace, he will follow outpatient with Dr. Yetta Barre post discharge after a week, he remains pain-free with no lower extremity weakness, no bowel bladder incontinence.      Code Status: DNR  Family Communication: wife  Disposition Plan: TBD   Procedures CT head, L spine Xray   Consults  Dr Yetta Barre N.Surg over the phone   DVT Prophylaxis Heparin   Lab Results  Component Value Date   PLT 271 03/10/2013    Medications  Scheduled Meds: . aspirin  81 mg Oral Daily  . azithromycin  250 mg Oral Daily  . calcium carbonate  1 tablet Oral Q breakfast  . cyanocobalamin  500 mcg Oral Daily  . heparin  5,000 Units Subcutaneous Q8H  . metoprolol tartrate  50 mg Oral BID  . omega-3 acid ethyl esters  3 g Oral Daily  . sodium chloride  3 mL Intravenous Q12H   Continuous Infusions:   PRN Meds:.albuterol, guaiFENesin-dextromethorphan, haloperidol lactate, HYDROcodone-acetaminophen, ondansetron (ZOFRAN) IV, ondansetron, polyethylene glycol  Antibiotics  Anti-infectives   Start     Dose/Rate Route Frequency Ordered Stop   03/10/13 1800  azithromycin (ZITHROMAX) 250 mg in dextrose 5 % 125 mL IVPB  Status:  Discontinued     250 mg 125 mL/hr over 60 Minutes Intravenous Every 24 hours 03/09/13 1831 03/10/13 0950   03/10/13 1100  azithromycin (ZITHROMAX) tablet 250 mg     250 mg Oral Daily 03/10/13 0950 03/15/13 0959   03/09/13 2000  azithromycin (ZITHROMAX) 500 mg in dextrose 5 % 250 mL IVPB     500 mg 250 mL/hr over 60 Minutes Intravenous  Once 03/09/13 1831 03/09/13 2116       Time Spent in minutes   35   Tobyn Osgood K M.D on 03/11/2013 at 8:40 AM  Between 7am to 7pm - Pager - [phone]  After 7pm go to [email or web address] - password [email or web address]  And look for the night coverage person covering for me after hours  [facility] Office  [phone]    Subjective:   Zackery Barefoot today has, No  headache, No chest pain, No abdominal pain - No Nausea, No new weakness tingling or numbness, No Cough - SOB. No back pain.   Objective:   Filed Vitals:   03/10/13 1416 03/10/13 2133 03/11/13 0500 03/11/13 0548  BP: 139/51 168/58  159/62  Pulse: 61 77  77  Temp: 98.1 F (36.7 C) 99.7 F (37.6 C)  98.9 F (37.2 C)  TempSrc: Oral Oral  Oral  Resp: 20 20  18   Height:      Weight:   80 kg (176 lb 5.9 oz)   SpO2: 96% 98%  97%    Wt Readings from Last 3 Encounters:  03/11/13 80 kg (176 lb 5.9 oz)  11/21/12 81.647 kg (180 lb)  08/09/12 78.926 kg (174 lb)     Intake/Output Summary (Last 24 hours) at 03/11/13 0840 Last data filed at 03/10/13 1700  Gross per 24 hour  Intake    720 ml  Output    150 ml  Net    570 ml    Exam Awake Alert, Oriented X 1, No new F.N deficits, Normal affect [place].AT,PERRAL Supple Neck,No JVD, No cervical lymphadenopathy appriciated.  Symmetrical Chest wall movement, Good air movement bilaterally, CTAB RRR,No Gallops,Rubs , +ve systolic Murmur, No Parasternal Heave +ve B.Sounds, Abd Soft, Non tender, No organomegaly appriciated, No rebound - guarding or rigidity. No spine tenderness. No Cyanosis, Clubbing or edema, No new Rash or bruise     Data Review   Micro Results No results found for this or any previous visit (from the past 240 hour(s)).  Radiology Reports Dg Chest 2 View  03/09/2013   *RADIOLOGY REPORT*  Clinical Data: Confusion, weakness  CHEST - 2 VIEW  Comparison: 04/19/2012  Findings: Low lung volumes with vascular crowding.  No focal consolidation. No pleural effusion or pneumothorax.  The heart is top normal in size for inspiration.  Left subclavian pacemaker.  Visualized osseous structures are within normal limits.  IMPRESSION: No evidence of acute cardiopulmonary disease.   Original Report Authenticated By: Charline Bills, M.D.   Dg Lumbar Spine Complete  03/09/2013   *RADIOLOGY REPORT*  Clinical Data: Low back pain post fall   LUMBAR SPINE - COMPLETE 4+ VIEW  Comparison: None  Findings: Osseous demineralization. Disc space narrowing L4-L5. Age indeterminate superior endplate compression fracture of L3 with 10-20% anterior height loss. No additional fracture, subluxation or bone destruction. No spondylolysis. Scattered atherosclerotic calcifications. SI joints symmetric.  IMPRESSION: Age indeterminate superior plate compression fracture of L3 with 10- 20% anterior height loss. Osseous demineralization.   Original Report Authenticated By: Ulyses Southward, M.D.   Ct Head Wo Contrast  03/09/2013   *RADIOLOGY REPORT*  Clinical Data: Altered mental status  CT HEAD WITHOUT CONTRAST  Technique:  Contiguous axial images were obtained from the base of the skull through the vertex without contrast.  Comparison: None.  Findings: There is diffuse patchy low density throughout the subcortical and periventricular white matter consistent with chronic small vessel ischemic change.  There is prominence of the sulci and ventricles consistent with brain atrophy.  There is no evidence for acute brain infarct, hemorrhage or mass.  There is mild mucosal thickening involving the right maxillary sinus.  The mastoid air cells are clear.  The skull is intact.  IMPRESSION:  1.  Small vessel gas beneath disease and brain atrophy. 2.  No acute intracranial abnormalities noted. 3.  Right maxillary sinus mucosal thickening.   Original Report Authenticated By: Signa Kell, M.D.    CBC  Recent Labs Lab 03/09/13 1540 03/10/13 0620  WBC 12.7* 11.6*  HGB 9.4* 9.2*  HCT 29.2* 28.9*  PLT 283 271  MCV 72.3* 73.5*  MCH 23.3* 23.4*  MCHC 32.2 31.8  RDW 16.8* 17.0*  LYMPHSABS 1.5  --   MONOABS 2.7*  --   EOSABS 0.0  --   BASOSABS 0.0  --     Chemistries   Recent Labs Lab 03/09/13 1540 03/09/13 1612 03/10/13 0620  NA 135  --  136  K 4.1  --  3.9  CL 102  --  102  CO2 23  --  25  GLUCOSE 125*  --  106*  BUN 13  --  15  CREATININE 0.64  --  0.60   CALCIUM 8.8  --  8.8  MG  --   --  2.4  AST  --  26  --   ALT  --  17  --   ALKPHOS  --  113  --   BILITOT  --  0.7  --    ------------------------------------------------------------------------------------------------------------------ estimated creatinine clearance is 73.2 ml/min (by C-G formula based on Cr of 0.6). ------------------------------------------------------------------------------------------------------------------  Recent Labs  03/09/13 1909  HGBA1C 6.3*   ------------------------------------------------------------------------------------------------------------------ No results found for this basename: CHOL, HDL, LDLCALC, TRIG, CHOLHDL, LDLDIRECT,  in the last 72 hours ------------------------------------------------------------------------------------------------------------------  Recent Labs  03/09/13 1909  TSH 1.437   ------------------------------------------------------------------------------------------------------------------  Recent Labs  03/09/13 1800 03/09/13 1909  VITAMINB12 987* 829    Coagulation profile  Recent Labs Lab 03/09/13 1909  INR 1.05    No results found for this basename: DDIMER,  in the last 72 hours  Cardiac Enzymes  Recent Labs Lab 03/10/13 0042 03/10/13 0620 03/10/13 1230  TROPONINI <0.30 <0.30 <0.30   ------------------------------------------------------------------------------------------------------------------ No components found with this basename: POCBNP,

## 2013-03-11 NOTE — Progress Notes (Signed)
Patient discharge to [facility].Report given to [facility]. Patient discharge with lumbar brace on.

## 2013-03-11 NOTE — Discharge Summary (Signed)
[facility]                                                                                   Lucas Khan, is a 79 y.o. male  DOB [birth year]  MRN [id number].  Admission date:  03/09/2013  Discharge Date:  03/11/2013  Primary MD  Carrie Mew, MD  Admitting Physician  Leroy Sea, MD  Admission Diagnosis  Memory loss [780.93] Aortic stenosis [424.1] Hyperlipidemia [272.4] Weakness [780.79] Hypertension [401.9] Mobitz type 2 second degree atrioventricular block [426.12]  Discharge Diagnosis     Active Problems:   HYPERLIPIDEMIA   HYPERTENSION   BENIGN PROSTATIC HYPERTROPHY, WITH OBSTRUCTION   SYMPTOM, MEMORY LOSS   Pacemaker-Medtronic   Mobitz type 2 second degree atrioventricular block   Aortic stenosis URI  Past Medical History  Diagnosis Date  . Hyperlipidemia   . Hypertension   . Memory loss   . Mobitz type 2 second degree atrioventricular block   . Aortic stenosis     Mean gradient 23  6/13  . Pacemaker - Medtronic     DOI-7/13    Past Surgical History  Procedure Laterality Date  . Appendectomy       Recommendations for primary care physician for things to follow:   Outpt Neuro follow up   Discharge Diagnoses:   Active Problems:   HYPERLIPIDEMIA   HYPERTENSION   BENIGN PROSTATIC HYPERTROPHY, WITH OBSTRUCTION   SYMPTOM, MEMORY LOSS   Pacemaker-Medtronic   Mobitz type 2 second degree atrioventricular block   Aortic stenosis    Discharge Condition: Stable   Diet recommendation: See Discharge Instructions below   Consults PT, Neuro surgery (phone)    History of present illness and  Hospital Course:     Kindly see H&P for history of present illness and admission details, please review complete Labs, Consult reports and Test reports for all details in brief Lucas Khan, is a 79 y.o. male, patient with history of Dementa, AS,Pacemaker, HTN, was admitted due to gradual and progressive decrease in mental status  due to dementia, had a fall the day of admission with L spine fracture, also had a mild URI.  N.surgery Dr Yetta Barre reviewed L Spine images for now supportive care with L spine brace and Outpt follow up with him in 1 week, wife is resistant to New Meds thinks they are poison, will treat URI, add lopressor for HTN, Aricept was not tolerated in the past per her, outpt Neurosurgery and neuro follow up. Stable for now, monitor for falls and aspiration. No back pain or leg weakness no incontinence.    Today   Subjective:   Lucas Khan today has no headache,no chest abdominal pain,no new weakness tingling or numbness, feels much better . No back pain.  Objective:   Blood pressure 159/62, pulse 77, temperature 98.9 F (37.2 C), temperature source Oral, resp. rate 18, height 5\' 6"  (1.676 m), weight 80 kg (176 lb 5.9 oz), SpO2 97.00%.   Intake/Output Summary (Last 24 hours) at 03/11/13 1132 Last data filed at 03/11/13 1026  Gross per 24 hour  Intake    603 ml  Output    150 ml  Net    453 ml    Exam Awake pleasently confused, No new F.N deficits, Normal affect [place].AT,PERRAL Supple Neck,No JVD, No cervical lymphadenopathy appriciated.  Symmetrical Chest wall movement, Good air movement bilaterally, CTAB RRR,No Gallops,Rubs or new Murmurs, No Parasternal Heave +ve B.Sounds, Abd Soft, Non tender, No organomegaly appriciated, No rebound -guarding or rigidity. No Cyanosis, Clubbing or edema, No new Rash or bruise  Data Review   Major procedures and Radiology Reports - PLEASE review detailed and final reports for all details in brief -       Dg Chest 2 View  03/09/2013   *RADIOLOGY REPORT*  Clinical Data: Confusion, weakness  CHEST - 2 VIEW  Comparison: 04/19/2012  Findings: Low lung volumes with vascular crowding.  No focal consolidation. No pleural effusion or pneumothorax.  The heart is top normal in size for inspiration.  Left subclavian pacemaker.  Visualized osseous structures are  within normal limits.  IMPRESSION: No evidence of acute cardiopulmonary disease.   Original Report Authenticated By: Charline Bills, M.D.   Dg Lumbar Spine Complete  03/09/2013   *RADIOLOGY REPORT*  Clinical Data: Low back pain post fall  LUMBAR SPINE - COMPLETE 4+ VIEW  Comparison: None  Findings: Osseous demineralization. Disc space narrowing L4-L5. Age indeterminate superior endplate compression fracture of L3 with 10-20% anterior height loss. No additional fracture, subluxation or bone destruction. No spondylolysis. Scattered atherosclerotic calcifications. SI joints symmetric.  IMPRESSION: Age indeterminate superior plate compression fracture of L3 with 10- 20% anterior height loss. Osseous demineralization.   Original Report Authenticated By: Ulyses Southward, M.D.   Ct Head Wo Contrast  03/09/2013   *RADIOLOGY REPORT*  Clinical Data: Altered mental status  CT HEAD WITHOUT CONTRAST  Technique:  Contiguous axial images were obtained from the base of the skull through the vertex without contrast.  Comparison: None.  Findings: There is diffuse patchy low density throughout the subcortical and periventricular white matter consistent with chronic small vessel ischemic change.  There is prominence of the sulci and ventricles consistent with brain atrophy.  There is no evidence for acute brain infarct, hemorrhage or mass.  There is mild mucosal thickening involving the right maxillary sinus.  The mastoid air cells are clear.  The skull is intact.  IMPRESSION:  1.  Small vessel gas beneath disease and brain atrophy. 2.  No acute intracranial abnormalities noted. 3.  Right maxillary sinus mucosal thickening.   Original Report Authenticated By: Signa Kell, M.D.    Micro Results      No results found for this or any previous visit (from the past 240 hour(s)).   CBC w Diff: Lab Results  Component Value Date   WBC 11.6* 03/10/2013   HGB 9.2* 03/10/2013   HCT 28.9* 03/10/2013   PLT 271 03/10/2013    LYMPHOPCT 11* 03/09/2013   MONOPCT 21* 03/09/2013   EOSPCT 0 03/09/2013   BASOPCT 0 03/09/2013    CMP: Lab Results  Component Value Date   NA 136 03/10/2013   K 3.9 03/10/2013   CL 102 03/10/2013   CO2 25 03/10/2013   BUN 15 03/10/2013   CREATININE 0.60 03/10/2013   PROT 6.8 03/09/2013   ALBUMIN 2.6* 03/09/2013   BILITOT 0.7 03/09/2013   ALKPHOS 113 03/09/2013   AST 26 03/09/2013   ALT 17 03/09/2013  .   Discharge Instructions     Follow with Primary MD Carrie Mew, MD in 7 days   Get CBC, CMP, checked 7 days by Primary MD and  again as instructed by your Primary MD. Get a 2 view Chest X ray done next visit.  Get Medicines reviewed and adjusted.  Please request your Prim.MD to go over all Hospital Tests and Procedure/Radiological results at the follow up, please get all Hospital records sent to your Prim MD by signing hospital release before you go home.  Activity: As tolerated with Full fall precautions use walker/cane & assistance as needed   Diet: Heart Healthy with full assistance and Aspiration precautions      For Heart failure patients - Check your Weight same time everyday, if you gain over 2 pounds, or you develop in leg swelling, experience more shortness of breath or chest pain, call your Primary MD immediately. Follow Cardiac Low Salt Diet and 1.8 lit/day fluid restriction.  Disposition SNF  If you experience worsening of your admission symptoms, develop shortness of breath, life threatening emergency, suicidal or homicidal thoughts you must seek medical attention immediately by calling 911 or calling your MD immediately  if symptoms less severe.  You Must read complete instructions/literature along with all the possible adverse reactions/side effects for all the Medicines you take and that have been prescribed to you. Take any new Medicines after you have completely understood and accpet all the possible adverse reactions/side effects.   Do not drive and provide baby  sitting services if your were admitted for syncope or siezures until you have seen by Primary MD or a Neurologist and advised to do so again.  Do not drive when taking Pain medications.    Do not take more than prescribed Pain, Sleep and Anxiety Medications  Special Instructions: If you have smoked or chewed Tobacco  in the last 2 yrs please stop smoking, stop any regular Alcohol  and or any Recreational drug use.  Wear Seat belts while driving.      Follow-up Information   Follow up with Carrie Mew, MD. Schedule an appointment as soon as possible for a visit in 3 days.   Contact information:   [address] [place] [address] [phone]       Follow up with Tia Alert, MD. Schedule an appointment as soon as possible for a visit in 1 week.   Contact information:   [address] [place] [address] [phone]       Follow up with Carrie Mew, MD.   Contact information:   [address] [place] [address] [phone]       Follow up with Evie Lacks, MD. Schedule an appointment as soon as possible for a visit in 1 week.   Contact information:   [address] [place] [address] [phone]         Discharge Medications     Medication List    TAKE these medications       amLODipine 5 MG tablet  Commonly known as:  NORVASC  Take 1 tablet (5 mg total) by mouth daily.     azithromycin 250 MG tablet  Commonly known as:  ZITHROMAX  3 more days     calcium carbonate 600 MG Tabs  Commonly known as:  OS-CAL  Take 600 mg by mouth daily.     cyanocobalamin 500 MCG tablet  Take 500 mcg by mouth daily.     fish oil-omega-3 fatty acids 1000 MG capsule  Take 3 g by mouth daily.     Ginkgo Biloba 40 MG Tabs  Take 1 tablet by mouth daily.  metoprolol 50 MG tablet  Commonly known as:  LOPRESSOR  Take 1 tablet (50 mg total) by mouth 2 (two) times daily.     PHOSPHATIDYLSERINE PO   Take 1 tablet by mouth daily.     saw palmetto 500 MG capsule  Take 500 mg by mouth daily.           Total Time in preparing paper work, data evaluation and todays exam - 35 minutes  Leroy Sea M.D on 03/11/2013 at 11:32 AM  [facility] Office  [phone]

## 2013-03-11 NOTE — Progress Notes (Signed)
Per MD, Pt ready for d/c.  Notified RN, Pt, family and facility.  Facility ready to receive Pt.  Arranged for transportation.  Amanda Cele Mote, LCSWA Clinical Social Work [phone]   

## 2013-03-12 NOTE — ED Provider Notes (Signed)
Medical screening examination/treatment/procedure(s) were performed by non-physician practitioner and as supervising physician I was immediately available for consultation/collaboration.    Alrick Cubbage L Nyko Gell, MD 03/12/13 1103 

## 2013-03-16 ENCOUNTER — Telehealth: Payer: Self-pay | Admitting: *Deleted

## 2013-03-16 NOTE — Telephone Encounter (Signed)
Transitional care  Admit date:03-09-2013 Discharge date:03-11-20  Admission diagnosis: Memory loss Aortic stenosis Hyperlipidemia Mobitz type 2 second degree atrioventricular block  Discharge diagnosis: Active problems   hyperlipidemia    Hypertension    Benign prostatic hypertrophy,with obstruction    Symptom,memory loss    Pacemaker-medtronic    Mobitz type 2 second degree  Atrioventricular block     Aortic stenosis  Lucas Khan  talked with wife and pt is still having memory problems and unsteady gait. Has compression fracture and brace ordered by Dr Yetta Barre, neurosurgeon. Wife is resistant to new medication, but lopressor was added for htn.  Pt is some what compliant with medication.  Appointment with dr Lovell Sheehan 03-17-2013.

## 2013-03-17 ENCOUNTER — Other Ambulatory Visit: Payer: Self-pay | Admitting: Internal Medicine

## 2013-03-17 ENCOUNTER — Ambulatory Visit: Payer: Medicare HMO | Admitting: Internal Medicine

## 2013-03-27 ENCOUNTER — Telehealth: Payer: Self-pay | Admitting: Internal Medicine

## 2013-03-27 ENCOUNTER — Ambulatory Visit: Payer: Medicare HMO | Admitting: Internal Medicine

## 2013-03-27 NOTE — Telephone Encounter (Signed)
Spoke with patient's wife who states she is concerned that patient has a pacemaker and had a CT scan when he fell the other day and was taken to the hospital.  I assured patient that a CT scan will not affect his pacemaker, that it is a MRI that patient cannot have w/this device.  Patient verbalized understanding. Patient's wife asked that I send this message to Dr. Graciela Husbands and for Dr. Graciela Husbands to please talk with Dr. Rene Paci @ [facility] because patient is extremely fatigued.

## 2013-03-27 NOTE — Telephone Encounter (Signed)
New Problem:    Patient's wife called in wanting to speak with someone about having a CT.  Please call back.

## 2013-04-17 ENCOUNTER — Encounter: Payer: Self-pay | Admitting: Family

## 2013-04-17 ENCOUNTER — Ambulatory Visit (INDEPENDENT_AMBULATORY_CARE_PROVIDER_SITE_OTHER): Payer: Medicare HMO | Admitting: Family

## 2013-04-17 VITALS — BP 112/60 | HR 60 | Wt 164.0 lb

## 2013-04-17 DIAGNOSIS — F3289 Other specified depressive episodes: Secondary | ICD-10-CM

## 2013-04-17 DIAGNOSIS — R5381 Other malaise: Secondary | ICD-10-CM

## 2013-04-17 DIAGNOSIS — W19XXXS Unspecified fall, sequela: Secondary | ICD-10-CM

## 2013-04-17 DIAGNOSIS — F32A Depression, unspecified: Secondary | ICD-10-CM

## 2013-04-17 DIAGNOSIS — R5383 Other fatigue: Secondary | ICD-10-CM

## 2013-04-17 DIAGNOSIS — F329 Major depressive disorder, single episode, unspecified: Secondary | ICD-10-CM

## 2013-04-17 DIAGNOSIS — I10 Essential (primary) hypertension: Secondary | ICD-10-CM

## 2013-04-17 LAB — CBC WITH DIFFERENTIAL/PLATELET
Basophils Absolute: 0 K/uL (ref 0.0–0.1)
Basophils Relative: 0.1 % (ref 0.0–3.0)
Eosinophils Absolute: 0.1 K/uL (ref 0.0–0.7)
Eosinophils Relative: 1.7 % (ref 0.0–5.0)
HCT: 29.2 % — ABNORMAL LOW (ref 39.0–52.0)
Hemoglobin: 9.1 g/dL — ABNORMAL LOW (ref 13.0–17.0)
Lymphocytes Relative: 15.6 % (ref 12.0–46.0)
Lymphs Abs: 1.1 K/uL (ref 0.7–4.0)
MCHC: 31.3 g/dL (ref 30.0–36.0)
MCV: 75.7 fl — ABNORMAL LOW (ref 78.0–100.0)
Monocytes Absolute: 1.3 K/uL — ABNORMAL HIGH (ref 0.1–1.0)
Monocytes Relative: 17.8 % — ABNORMAL HIGH (ref 3.0–12.0)
Neutro Abs: 4.6 K/uL (ref 1.4–7.7)
Neutrophils Relative %: 64.8 % (ref 43.0–77.0)
Platelets: 346 K/uL (ref 150.0–400.0)
RBC: 3.86 Mil/uL — ABNORMAL LOW (ref 4.22–5.81)
RDW: 22.5 % — ABNORMAL HIGH (ref 11.5–14.6)
WBC: 7.1 K/uL (ref 4.5–10.5)

## 2013-04-17 NOTE — Progress Notes (Signed)
Subjective:    Patient ID: Lucas Khan, male    DOB: August 01, 1931, 79 y.o.   MRN: [id number]  HPI 79 year old male, nonsmoker, patient of Dr. Lovell Sheehan is in for a hospital follow-up. He was hospitalized after a mechanical fall on Mar 09, 2013. Spent 2 days in the hospital and was transferred to [facility] x3-4 weeks for rehab. He is currently under the care of his wife and [place] at home. Wife continues to c/o fatigue. He has progressive dementia and has been diagnosed approx 8 years. She report him sleeping more and memory worse. His medication regimen has remained stable despite the hospitalization. Wife believes that his fatigues worsened after receiving a pacemaker earlier this year. He has a follow-up with Dr. Graciela Husbands in 2 weeks.    Review of Systems  Constitutional: Positive for fatigue.  HENT: Negative.   Respiratory: Negative.   Cardiovascular: Negative.   Gastrointestinal: Positive for constipation. Negative for nausea, diarrhea and rectal pain.  Endocrine: Negative.   Genitourinary: Negative.   Musculoskeletal: Negative.   Skin: Negative.   Neurological: Negative.   Psychiatric/Behavioral: Negative.    Past Medical History  Diagnosis Date  . Hyperlipidemia   . Hypertension   . Memory loss   . Mobitz type 2 second degree atrioventricular block   . Aortic stenosis     Mean gradient 23  6/13  . Pacemaker - Medtronic     DOI-7/13    History   Social History  . Marital Status: Married    Spouse Name: N/A    Number of Children: N/A  . Years of Education: N/A   Occupational History  . retired    Social History Main Topics  . Smoking status: Former Smoker    Types: Pipe    Quit date: 01/17/2010  . Smokeless tobacco: Never Used  . Alcohol Use: No  . Drug Use: No  . Sexually Active: Not Currently   Other Topics Concern  . Not on file   Social History Narrative  . No narrative on file    Past Surgical History  Procedure Laterality Date  .  Appendectomy      Family History  Problem Relation Age of Onset  . Memory loss      Allergies  Allergen Reactions  . Penicillins     Wife doesn't know what type of reaction  . Amoxicillin     REACTION: unspecified  . Statins     REACTION: unspecified    Current Outpatient Prescriptions on File Prior to Visit  Medication Sig Dispense Refill  . amLODipine (NORVASC) 5 MG tablet Take 1 tablet (5 mg total) by mouth daily.  90 tablet  3  . calcium carbonate (OS-CAL) 600 MG TABS Take 600 mg by mouth daily.      . cyanocobalamin 500 MCG tablet Take 500 mcg by mouth daily.      . fish oil-omega-3 fatty acids 1000 MG capsule Take 3 g by mouth daily.       . Ginkgo Biloba 40 MG TABS Take 1 tablet by mouth daily.      . metoprolol (LOPRESSOR) 50 MG tablet Take 1 tablet (50 mg total) by mouth 2 (two) times daily.      Marland Kitchen PHOSPHATIDYLSERINE PO Take 1 tablet by mouth daily.       . saw palmetto 500 MG capsule Take 500 mg by mouth daily.      Marland Kitchen azithromycin (ZITHROMAX) 250 MG tablet 3 more days  6 each  0   No current facility-administered medications on file prior to visit.    BP 112/60  Pulse 60  Wt 164 lb (74.39 kg)  BMI 26.48 kg/m2  SpO2 93%chart    Objective:   Physical Exam  Constitutional: He is oriented to person, place, and time. He appears well-developed and well-nourished.  HENT:  Right Ear: External ear normal.  Left Ear: External ear normal.  Nose: Nose normal.  Mouth/Throat: Oropharynx is clear and moist.  Neck: Normal range of motion. Neck supple.  Cardiovascular: Normal rate, regular rhythm and normal heart sounds.   Pulmonary/Chest: Effort normal and breath sounds normal.  Abdominal: Soft. Bowel sounds are normal.  Neurological: He is alert and oriented to person, place, and time.  Skin: Skin is warm and dry.  Psychiatric: He has a normal mood and affect.          Assessment & Plan:  Assessment:  1. Dementia 2. Fatigue 3. Depression 4. Fall  Risk  Plan: Labs sent. Continue current medications. See Dr. Graciela Husbands as scheduled. D/c all NSAID products. Start Omeprazole once a day. Recheck in 2-3 weeks for Hemoglobin, lab only. Discussed case with Dr. Lovell Sheehan.

## 2013-04-20 ENCOUNTER — Other Ambulatory Visit: Payer: Self-pay | Admitting: *Deleted

## 2013-04-20 MED ORDER — METOPROLOL TARTRATE 50 MG PO TABS: 50.0000 mg | ORAL_TABLET | Freq: Two times a day (BID) | ORAL | Status: DC

## 2013-04-20 NOTE — Telephone Encounter (Signed)
Routing again

## 2013-04-24 ENCOUNTER — Telehealth: Payer: Self-pay | Admitting: *Deleted

## 2013-04-24 MED ORDER — ESCITALOPRAM OXALATE 20 MG PO TABS: 20.0000 mg | ORAL_TABLET | Freq: Every day | ORAL | Status: DC

## 2013-04-24 NOTE — Telephone Encounter (Signed)
Pt has an appt with Dr Lovell Sheehan in August.  Sophia, RN with [facility] called and wanted Dr Lovell Sheehan to know that she is concerned that pt has been lethargic all day everyday while taking the lexapro and lopressor.  Also got a refill request from pharmacy to refill the lexapro.  Per Dr Lovell Sheehan ok to refill the lexapro.  Rx sent in electronically to [facility]

## 2013-04-25 ENCOUNTER — Telehealth: Payer: Self-pay | Admitting: Internal Medicine

## 2013-04-25 ENCOUNTER — Other Ambulatory Visit: Payer: Self-pay | Admitting: Internal Medicine

## 2013-04-25 NOTE — Telephone Encounter (Signed)
Pts wife informed.

## 2013-04-25 NOTE — Telephone Encounter (Signed)
Yes, continue current medications including Lopressor.

## 2013-04-25 NOTE — Telephone Encounter (Signed)
Can  You help with this

## 2013-04-25 NOTE — Telephone Encounter (Signed)
Pt's wife states the pt's MD from the hospital after his fall  has put him on metoprolol (LOPRESSOR) 50 MG tablet  Wife would like to know if it is it necessary  that he continue on lopressor. Wife would like a call.

## 2013-04-28 ENCOUNTER — Telehealth: Payer: Self-pay | Admitting: Internal Medicine

## 2013-04-28 NOTE — Telephone Encounter (Signed)
Pt's wife called because she wanted to make sure Dr. Tera Helper has all the lab results done on pt in May and June in [facility]. Pt has been very weak for sometime. Pt has an appointment wit Dr. Graciela Husbands on Tuesday July 15 th. Pt's wife is aware that the lab results are in the pt's documents. Pt's wife verbalized understanding.

## 2013-04-28 NOTE — Telephone Encounter (Signed)
New problem    pts wife called w/PT & they want to know if pt can be seen today due to SOB-pt has appt on Tuesday but they feel he needs to be seen today

## 2013-04-28 NOTE — Telephone Encounter (Signed)
New Problem  Pt wife states that her husband is really weak and cannot walk at times. She said that he had blood work done in [place] in May and June and she wants to make sure that Dr Graciela Husbands has received the results so that they can figure out what it going on with her husband.

## 2013-04-28 NOTE — Telephone Encounter (Addendum)
**Note De-Identified  Obfuscation** Pts wife wants pt seen by Dr Graciela Husbands or Dr Johney Frame today according to Onalee Hua with PT due to pts SOB and O2 dropping to 83% while walking in PT this am Onalee Hua states that O2 went up to 97% 2 mins later) . Pt has appt with Dr Graciela Husbands on 7/15. Onalee Hua is advised that Dr's Graciela Husbands and Allred are not in office today and to contact pt's PCP, Onalee Hua verbalized understanding.

## 2013-05-01 NOTE — Telephone Encounter (Signed)
Pt has a follow up appt 05-02-13.

## 2013-05-02 ENCOUNTER — Encounter: Payer: Self-pay | Admitting: Internal Medicine

## 2013-05-02 ENCOUNTER — Ambulatory Visit (INDEPENDENT_AMBULATORY_CARE_PROVIDER_SITE_OTHER): Payer: Medicare HMO | Admitting: Internal Medicine

## 2013-05-02 VITALS — BP 115/51 | HR 60 | Ht 66.0 in | Wt 146.0 lb

## 2013-05-02 DIAGNOSIS — I10 Essential (primary) hypertension: Secondary | ICD-10-CM

## 2013-05-02 DIAGNOSIS — D649 Anemia, unspecified: Secondary | ICD-10-CM

## 2013-05-02 DIAGNOSIS — I441 Atrioventricular block, second degree: Secondary | ICD-10-CM

## 2013-05-02 DIAGNOSIS — D509 Iron deficiency anemia, unspecified: Secondary | ICD-10-CM | POA: Insufficient documentation

## 2013-05-02 DIAGNOSIS — Z950 Presence of cardiac pacemaker: Secondary | ICD-10-CM

## 2013-05-02 DIAGNOSIS — R413 Other amnesia: Secondary | ICD-10-CM

## 2013-05-02 LAB — CBC WITH DIFFERENTIAL/PLATELET
Basophils Absolute: 0 10*3/uL (ref 0.0–0.1)
Basophils Relative: 0.1 % (ref 0.0–3.0)
Eosinophils Absolute: 0.2 10*3/uL (ref 0.0–0.7)
Lymphocytes Relative: 17.5 % (ref 12.0–46.0)
MCHC: 30.8 g/dL (ref 30.0–36.0)
MCV: 74.3 fl — ABNORMAL LOW (ref 78.0–100.0)
Monocytes Absolute: 1.2 10*3/uL — ABNORMAL HIGH (ref 0.1–1.0)
Neutrophils Relative %: 62.3 % (ref 43.0–77.0)
Platelets: 377 10*3/uL (ref 150.0–400.0)
RDW: 21.9 % — ABNORMAL HIGH (ref 11.5–14.6)

## 2013-05-02 LAB — PACEMAKER DEVICE OBSERVATION
AL IMPEDENCE PM: 438 Ohm
AL THRESHOLD: 0.5 V
ATRIAL PACING PM: 24
BAMS-0001: 150 {beats}/min
RV LEAD IMPEDENCE PM: 445 Ohm
RV LEAD THRESHOLD: 0.75 V

## 2013-05-02 LAB — IBC PANEL
Saturation Ratios: 5.5 % — ABNORMAL LOW (ref 20.0–50.0)
Transferrin: 258.9 mg/dL (ref 212.0–360.0)

## 2013-05-02 NOTE — Assessment & Plan Note (Signed)
Blood pressures not all that elevated today. I think it is worth stopping the medications and letting the wife see how his and he does. Gradual reintroduction by his PCP can be done with close monitoring is necessary

## 2013-05-02 NOTE — Assessment & Plan Note (Signed)
Will check FeTIBC parameters  He may need further evaluatoin

## 2013-05-02 NOTE — Assessment & Plan Note (Signed)
The patient's device was interrogated.  The information was reviewed. No changes were made in the programming.    

## 2013-05-02 NOTE — Assessment & Plan Note (Signed)
Incredibly stressful situation. We discussed alternative evaluation centers i[facility]e. [facility]. Currently there is now a dementia Center on the corner of [address] and [facility] she will see about daily herself of this. I've also encouraged her to seek respite for herself in terms of her care needs

## 2013-05-02 NOTE — Patient Instructions (Addendum)
Your physician has recommended you make the following change in your medication:  1) Stop amlodipine (norvasc). 2) Stop Lexapro. 3) Decrease metoprolol to 1/2 tablet by mouth twice daily for 5 days, then stop.  Your physician recommends that you have lab work today: Fe/TIBC/CBC  Remote monitoring is used to monitor your Pacemaker of ICD from home. This monitoring reduces the number of office visits required to check your device to one time per year. It allows [place] to keep an eye on the functioning of your device to ensure it is working properly. You are scheduled for a device check from home on 08/07/13. You may send your transmission at any time that day. If you have a wireless device, the transmission will be sent automatically. After your physician reviews your transmission, you will receive a postcard with your next transmission date.   Your physician wants you to follow-up in: 1 year with Dr. Graciela Husbands. You will receive a reminder letter in the mail two months in advance. If you don't receive a letter, please call our office to schedule the follow-up appointment.

## 2013-05-02 NOTE — Progress Notes (Signed)
Patient Care Team: Stacie Glaze, MD as PCP - General   HPI  NIDAL RIVET is a 79 y.o. male Seen in followup for pacemaker implanted for second-degree heart block with exercise intolerance. This was 7/13.  She has noted no significant improvement in exercise tolerance.  He continues to sleep a lot.   He had a recent fall. He was at rehabilitation some weeks. He was started on amlodipine, Lexapro, and beta blockers. She is not sure that they have given him any benefit  she is worried about the possibility that they're making things worse.  She is exhausted, emotionally and physically. Her son whom she has not seen in 10 years came for a 10 day visit.  Review of his labs shows anemia with microcytoisis     Past Medical History  Diagnosis Date  . Hyperlipidemia   . Hypertension   . Memory loss   . Mobitz type 2 second degree atrioventricular block   . Aortic stenosis     Mean gradient 23  6/13  . Pacemaker - Medtronic     DOI-7/13    Past Surgical History  Procedure Laterality Date  . Appendectomy      Current Outpatient Prescriptions  Medication Sig Dispense Refill  . amLODipine (NORVASC) 5 MG tablet Take 1 tablet (5 mg total) by mouth daily.  90 tablet  3  . calcium carbonate (OS-CAL) 600 MG TABS Take 600 mg by mouth daily.      . cyanocobalamin 500 MCG tablet Take 500 mcg by mouth daily.      Marland Kitchen escitalopram (LEXAPRO) 20 MG tablet Take 1 tablet (20 mg total) by mouth daily.  90 tablet  3  . fish oil-omega-3 fatty acids 1000 MG capsule Take 3 g by mouth daily.       . Ginkgo Biloba 40 MG TABS Take 1 tablet by mouth daily.      . metoprolol (LOPRESSOR) 50 MG tablet TAKE ONE TABLET BY MOUTH TWICE DAILY  60 tablet  11  . PHOSPHATIDYLSERINE PO Take 1 tablet by mouth daily.       . saw palmetto 500 MG capsule Take 500 mg by mouth daily.       No current facility-administered medications for this visit.    Allergies  Allergen Reactions  . Penicillins     Wife doesn't  know what type of reaction  . Amoxicillin     REACTION: unspecified  . Statins     REACTION: unspecified    Review of Systems negative except from HPI and PMH  Physical Exam BP 115/51  Pulse 60  Ht 5\' 6"  (1.676 m)  Wt 146 lb (66.225 kg)  BMI 23.58 kg/m2 Well developed and well nourished in no acute distress HENT normal E scleral and icterus clear Neck Supple JVP flat; carotids brisk and full Clear to ausculation  Regular rate and rhythm, no murmurs gallops or rub Soft with active bowel sounds No clubbing cyanosis none Edema Alert and oriented, grossly normal motor and sensory function Skin Warm and Dry  Av pacing  Sitting in a wheelchair  Assessment and  Plan

## 2013-05-03 ENCOUNTER — Telehealth: Payer: Self-pay | Admitting: Internal Medicine

## 2013-05-03 NOTE — Telephone Encounter (Signed)
Spoke with pt wife, aware iron counts are low on the blood work done yesterday. Will forward to dr Lovell Sheehan for his review and treatment. Wife voiced understanding. Will mail a copy to their home address.

## 2013-05-03 NOTE — Telephone Encounter (Signed)
New prob  Pt wife wants to speak with you concerning his anemia.

## 2013-05-12 ENCOUNTER — Telehealth: Payer: Self-pay | Admitting: Internal Medicine

## 2013-05-12 DIAGNOSIS — F0280 Dementia in other diseases classified elsewhere without behavioral disturbance: Secondary | ICD-10-CM

## 2013-05-12 NOTE — Telephone Encounter (Signed)
Per dr Lovell Sheehan- stay off  Medication that dr Graciela Husbands stopped,but continue metoprolol 1/2 qd- referral to dr Everlena Cooper

## 2013-05-12 NOTE — Telephone Encounter (Signed)
Pt would like to call you w/ a referral ro a MD for pt. Wife refused another provider until she speaks w/ Rushie Goltz Wife feels like pt is rapidly declining and would like a MD to see asap. Pt went to [facility] 5/22 where they did blood work and she has not heard results. Pls advise

## 2013-05-19 ENCOUNTER — Telehealth: Payer: Self-pay | Admitting: Internal Medicine

## 2013-05-19 NOTE — Telephone Encounter (Signed)
send

## 2013-05-19 NOTE — Telephone Encounter (Signed)
Pt needs rx for shingle vaccine fax to [facility] on [address]. [facility] phone [phone]

## 2013-05-30 ENCOUNTER — Ambulatory Visit: Payer: Medicare HMO | Admitting: Neurology

## 2013-06-16 ENCOUNTER — Encounter: Payer: Self-pay | Admitting: Internal Medicine

## 2013-06-16 ENCOUNTER — Ambulatory Visit (INDEPENDENT_AMBULATORY_CARE_PROVIDER_SITE_OTHER): Payer: Medicare HMO | Admitting: Internal Medicine

## 2013-06-16 VITALS — BP 120/74 | HR 76 | Temp 98.6°F | Resp 16 | Ht 66.0 in | Wt 150.0 lb

## 2013-06-16 DIAGNOSIS — D518 Other vitamin B12 deficiency anemias: Secondary | ICD-10-CM

## 2013-06-16 DIAGNOSIS — D509 Iron deficiency anemia, unspecified: Secondary | ICD-10-CM

## 2013-06-16 DIAGNOSIS — I10 Essential (primary) hypertension: Secondary | ICD-10-CM

## 2013-06-16 DIAGNOSIS — I359 Nonrheumatic aortic valve disorder, unspecified: Secondary | ICD-10-CM

## 2013-06-16 DIAGNOSIS — D519 Vitamin B12 deficiency anemia, unspecified: Secondary | ICD-10-CM

## 2013-06-16 DIAGNOSIS — R413 Other amnesia: Secondary | ICD-10-CM

## 2013-06-16 DIAGNOSIS — I350 Nonrheumatic aortic (valve) stenosis: Secondary | ICD-10-CM

## 2013-06-16 MED ORDER — CYANOCOBALAMIN 1000 MCG/ML IJ SOLN
1000.0000 ug | INTRAMUSCULAR | Status: DC
  Administered 2013-06-16: 1000 ug via INTRAMUSCULAR

## 2013-06-16 MED ORDER — IRON-VIT C-VIT B12-FOLIC ACID 100-250-0.025-1 MG PO TABS: 1.0000 | ORAL_TABLET | Freq: Every day | ORAL | Status: DC

## 2013-06-16 NOTE — Progress Notes (Signed)
  Subjective:    Patient ID: Lucas Khan, male    DOB: Mar 07, 1931, 79 y.o.   MRN: [id number]  HPI Patient has severe weight loss Blood pressure stable on low dose toprol Progressive anemia Low iron ?blood loss chronic disease   Review of Systems  Constitutional: Positive for fatigue.  HENT: Positive for congestion and rhinorrhea.   Eyes: Positive for visual disturbance.  Respiratory: Positive for shortness of breath.   Cardiovascular: Negative for chest pain, palpitations and leg swelling.  Gastrointestinal: Negative for abdominal pain, abdominal distention and anal bleeding.  Genitourinary: Positive for urgency and frequency.  Neurological: Positive for weakness.       Objective:   Physical Exam  Cardiovascular: Normal rate and regular rhythm.   Murmur heard. Pulmonary/Chest: Breath sounds normal. He has no wheezes. He has no rales.    Pale white male, somnolent, decreased responsiveness and closed eyes Blood pressure and vitals reveiwed Weight decreased Lungs clear Heart rate regular Abdomin soft bowel sounds normal Extremities no edema Pulses +2 and equal       Assessment & Plan:  Severe progressive dementia Possible depression Severe debilitation Needs PT and OT... But wife states PT could not get him to cooperate. Resume iron

## 2013-06-16 NOTE — Addendum Note (Signed)
Addended by: Pau Eddy on: 06/16/2013 05:33 PM   Modules accepted: Orders

## 2013-06-17 LAB — CBC WITH DIFFERENTIAL/PLATELET
Basophils Relative: 1 % (ref 0–1)
Eosinophils Absolute: 0.4 10*3/uL (ref 0.0–0.7)
Eosinophils Relative: 4 % (ref 0–5)
Lymphs Abs: 2 10*3/uL (ref 0.7–4.0)
MCH: 21.2 pg — ABNORMAL LOW (ref 26.0–34.0)
MCHC: 29.9 g/dL — ABNORMAL LOW (ref 30.0–36.0)
MCV: 71 fL — ABNORMAL LOW (ref 78.0–100.0)
Monocytes Relative: 14 % — ABNORMAL HIGH (ref 3–12)
Neutrophils Relative %: 59 % (ref 43–77)
Platelets: 430 10*3/uL — ABNORMAL HIGH (ref 150–400)
RBC: 4.62 MIL/uL (ref 4.22–5.81)

## 2013-06-20 ENCOUNTER — Ambulatory Visit: Payer: Medicare HMO | Admitting: Neurology

## 2013-06-22 ENCOUNTER — Other Ambulatory Visit: Payer: Self-pay | Admitting: *Deleted

## 2013-06-22 MED ORDER — IRON-VIT C-VIT B12-FOLIC ACID 100-250-0.025-1 MG PO TABS: 1.0000 | ORAL_TABLET | Freq: Every day | ORAL | Status: AC

## 2013-06-29 IMAGING — CR DG CHEST 2V
2 series · 2 of 2 positions shown · non-contrast
Comparison: 04/19/2012

CLINICAL DATA: Confusion, weakness

CHEST - 2 VIEW

[w chest lat]
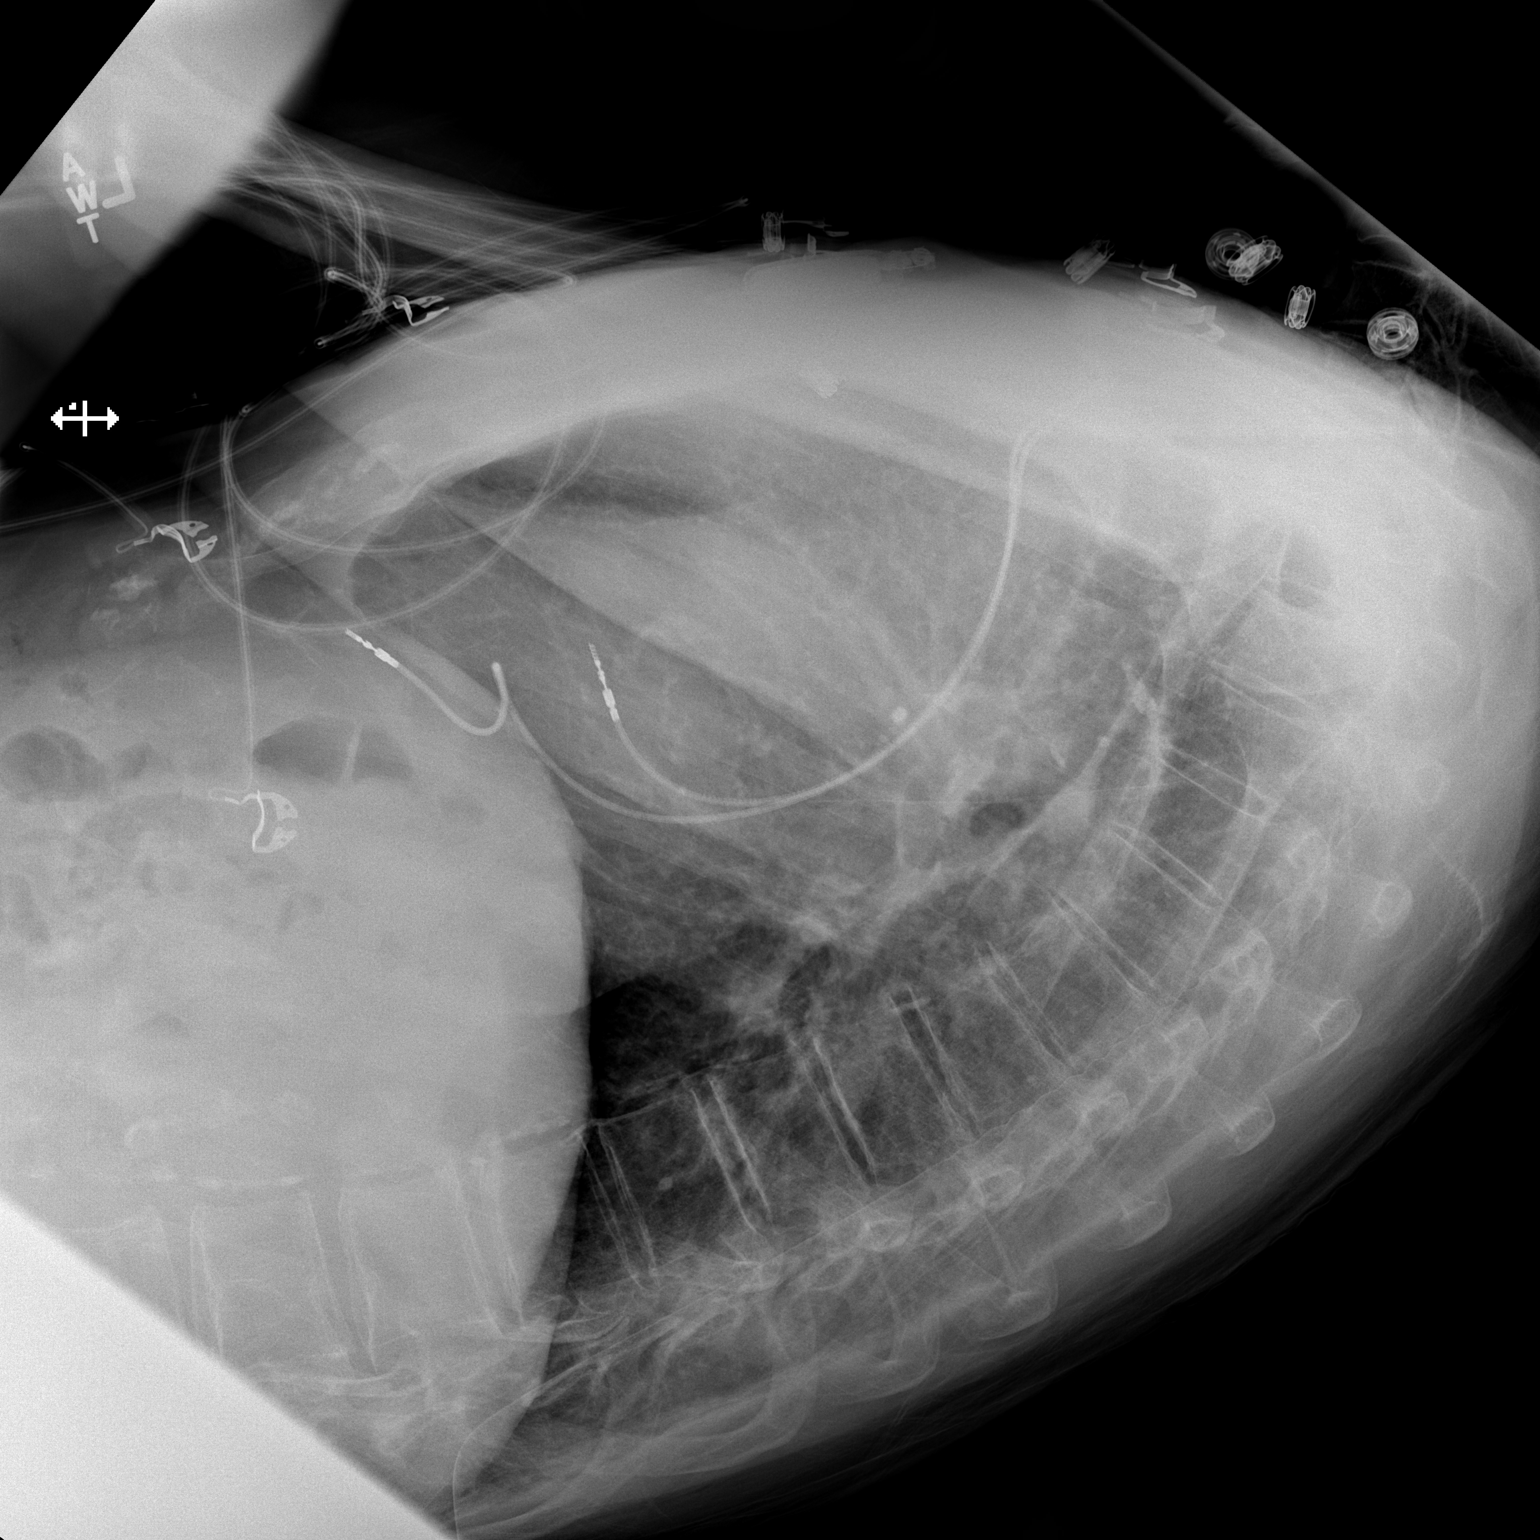

[x chest ap]
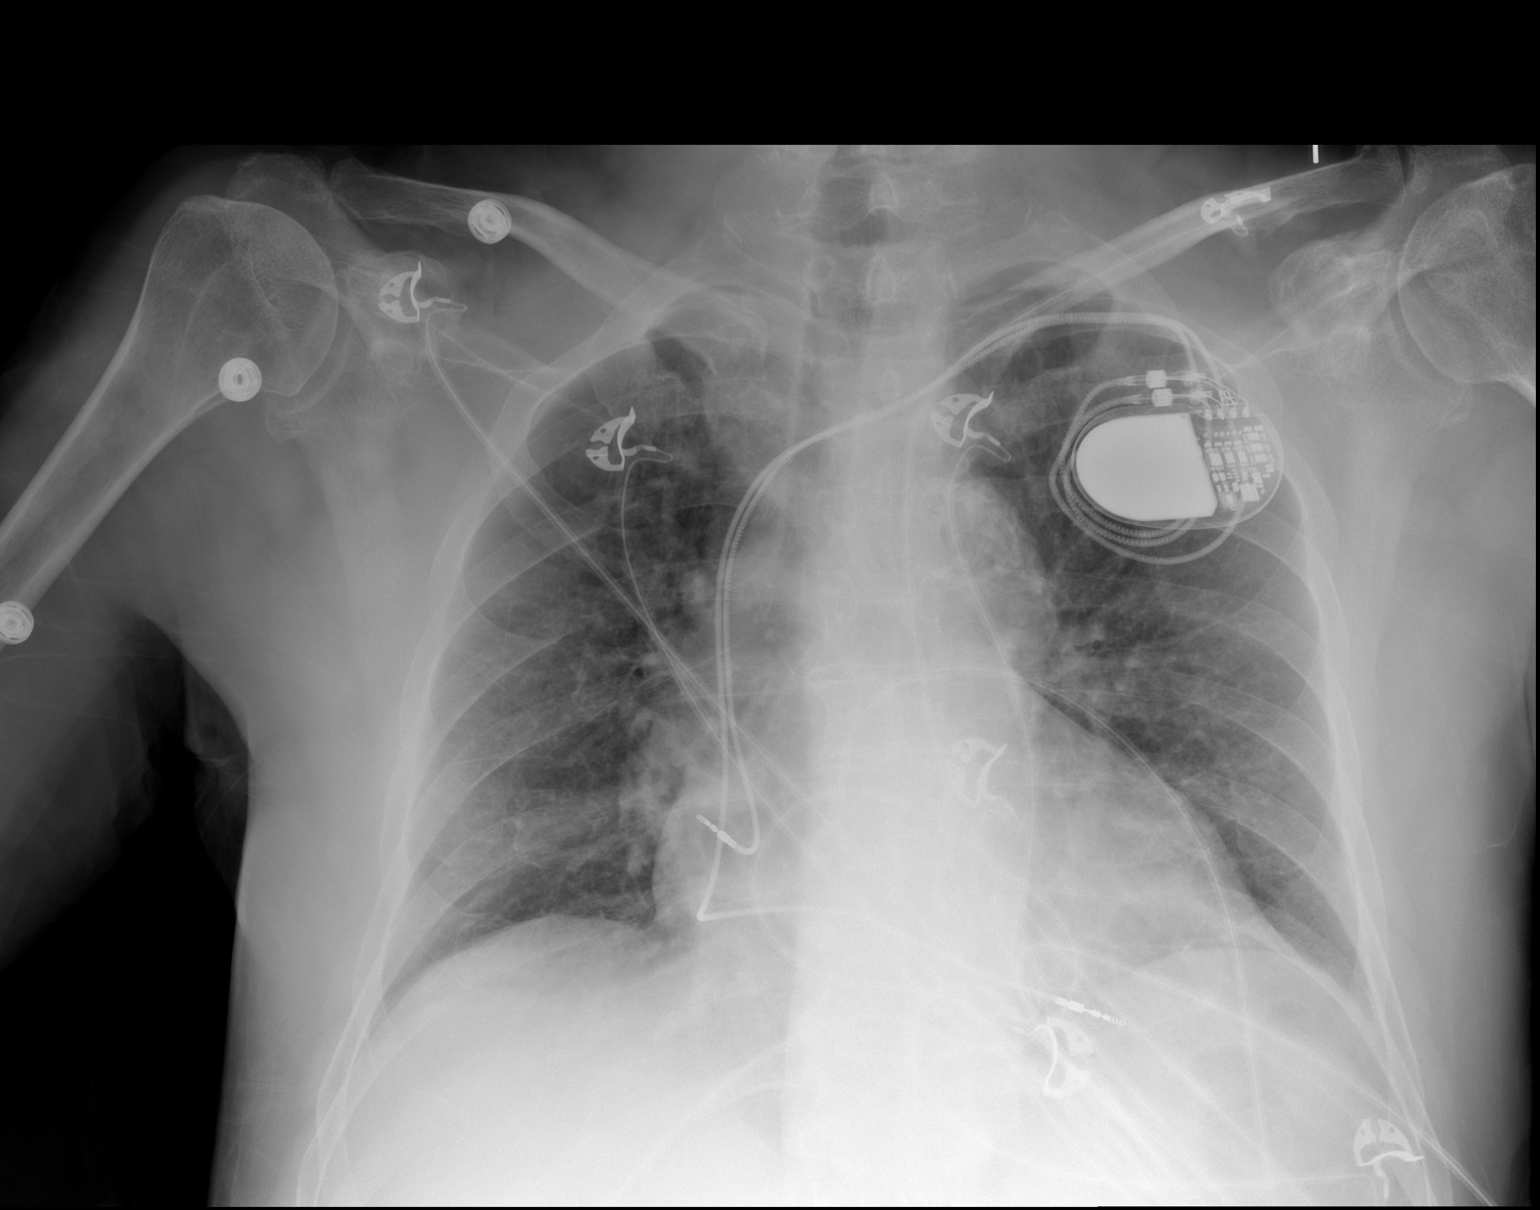

[2 of 2 positions shown; findings below may reference images not displayed]

FINDINGS: Low lung volumes with vascular crowding.  No focal
consolidation. No pleural effusion or pneumothorax.

The heart is top normal in size for inspiration.

Left subclavian pacemaker.

Visualized osseous structures are within normal limits.
IMPRESSION: No evidence of acute cardiopulmonary disease.

## 2013-07-07 ENCOUNTER — Ambulatory Visit: Payer: Medicare HMO | Admitting: Neurology

## 2013-07-21 ENCOUNTER — Other Ambulatory Visit: Payer: Self-pay | Admitting: *Deleted

## 2013-07-21 DIAGNOSIS — F0280 Dementia in other diseases classified elsewhere without behavioral disturbance: Secondary | ICD-10-CM

## 2013-07-24 ENCOUNTER — Ambulatory Visit (INDEPENDENT_AMBULATORY_CARE_PROVIDER_SITE_OTHER): Payer: Medicare HMO | Admitting: Internal Medicine

## 2013-07-24 DIAGNOSIS — Z23 Encounter for immunization: Secondary | ICD-10-CM

## 2013-08-01 ENCOUNTER — Ambulatory Visit: Payer: Self-pay | Admitting: Neurology

## 2013-08-03 ENCOUNTER — Encounter: Payer: Self-pay | Admitting: Neurology

## 2013-08-03 ENCOUNTER — Ambulatory Visit (INDEPENDENT_AMBULATORY_CARE_PROVIDER_SITE_OTHER): Payer: Medicare HMO | Admitting: Neurology

## 2013-08-03 ENCOUNTER — Encounter (INDEPENDENT_AMBULATORY_CARE_PROVIDER_SITE_OTHER): Payer: Self-pay

## 2013-08-03 VITALS — BP 138/75 | HR 73 | Wt 148.0 lb

## 2013-08-03 DIAGNOSIS — F0280 Dementia in other diseases classified elsewhere without behavioral disturbance: Secondary | ICD-10-CM

## 2013-08-03 NOTE — Progress Notes (Signed)
[facility]    Provider:  Dr Hosie Poisson Referring Provider: Stacie Glaze, MD Primary Care Physician:  Carrie Mew, MD  CC:  Cognitive decline  HPI:  Lucas Khan is a 79 y.o. male here as a referral from Dr. Lovell Sheehan for cognitive decline  Evaluated by Dr Pearlean Brownie in 2007 for cognitive decline, was initially prescribed Aricept, wife said it was stopped in 2010. Wife notes that it was causing trouble with his heart, she feels it led to him requiring a PPM. At that time he had a MRI of the brain and an EEG. He is currently taking Huperizine A and phosphatidylserine. Wife thinks these medications are helping him.   Had a fall a few months ago, went to hospital and rehab facility. At that time they started him on Lexapro which she thinks causes him to feel worse. Wife is concerned that he is declining. He has difficulty with translating, decreased energy, decreased motivation and energy. No focal weakness. She denies any hallucinations or agitation.   Review of Systems: Out of a complete 14 system review, the patient complains of only the following symptoms, and all other reviewed systems are negative. Positive for weight loss fatigue anemia shortness of breath murmur increased thirst aching muscles constipation hypertension allergies runny nose memory loss confusion dizziness depression too much sleep change in appetite disinterest in activities  History   Social History  . Marital Status: Married    Spouse Name: Mireille ([place] Mi)    Number of Children: N/A  . Years of Education: College   Occupational History  . retired    Social History Main Topics  . Smoking status: Former Smoker    Types: Pipe    Quit date: 01/17/2010  . Smokeless tobacco: Never Used  . Alcohol Use: No  . Drug Use: No  . Sexual Activity: Not Currently   Other Topics Concern  . Not on file   Social History Narrative   Patient lives at home with his spouse.   Caffeine Use: 2-3  cups daily    Family History  Problem Relation Age of Onset  . Memory loss      Past Medical History  Diagnosis Date  . Hyperlipidemia   . Hypertension   . Memory loss   . Mobitz type 2 second degree atrioventricular block   . Aortic stenosis     Mean gradient 23  6/13  . Pacemaker - Medtronic     DOI-7/13    Past Surgical History  Procedure Laterality Date  . Appendectomy      Current Outpatient Prescriptions  Medication Sig Dispense Refill  . calcium carbonate (OS-CAL) 600 MG TABS Take 600 mg by mouth daily.      . cyanocobalamin 500 MCG tablet Take 500 mcg by mouth daily.      . fish oil-omega-3 fatty acids 1000 MG capsule Take 3 g by mouth daily.       . Ginkgo Biloba 40 MG TABS Take 1 tablet by mouth daily.      . Iron-Vit C-Vit B12-Folic Acid 100-250-0.025-1 MG TABS Take 1 capsule by mouth daily.  30 each  2  . magnesium 30 MG tablet Take 30 mg by mouth 2 (two) times daily.      . metoprolol succinate (TOPROL-XL) 50 MG 24 hr tablet Take 25 mg by mouth daily. Take with or immediately following a meal.      . Multiple Vitamin (MULTIVITAMIN) tablet Take 1 tablet by mouth daily.      Marland Kitchen  PHOSPHATIDYLSERINE PO Take 1 tablet by mouth daily.       . saw palmetto 500 MG capsule Take 500 mg by mouth daily.       No current facility-administered medications for this visit.    Allergies as of 08/03/2013 - Review Complete 08/03/2013  Allergen Reaction Noted  . Penicillins  03/09/2013  . Amoxicillin  03/08/2007  . Statins  03/08/2007    Vitals: BP 138/75  Pulse 73 Last Weight:  Wt Readings from Last 1 Encounters:  06/16/13 150 lb (68.04 kg)   Last Height:   Ht Readings from Last 1 Encounters:  06/16/13 5\' 6"  (1.676 m)     Physical exam: Exam: Gen: NAD, conversant Eyes: anicteric sclerae, moist conjunctivae HENT: Atraumatic, oropharynx clear Neck: Trachea midline; supple,  Lungs: CTA, no wheezing, rales, rhonic                          CV: RRR, systolic  murmur noted Abdomen: Soft, non-tender;  Extremities: No peripheral edema  Skin: Normal temperature, no rash,  Psych: Appropriate affect, pleasant  Neuro: MS:  MOCA 9/30  CN: PERRL, EOMI no nystagmus, no ptosis, sensation intact to LT V1-V3 bilat, face symmetric, no weakness  Motor: normal bulk and tone Minimal movement but appears to move all extremities symmetrically  Reflexes: symmetrical, bilat downgoing toes  Sens: LT intact in all extremities  Gait: wheel chair bound, did not attempt to walk at this time   Assessment:  After physical and neurologic examination, review of laboratory studies, imaging, neurophysiology testing and pre-existing records, assessment will be reviewed on the problem list.  Plan:  Treatment plan and additional workup will be reviewed under Problem List.  79y/o with cognitive decline presenting for initial evaluation. Has seen Dr Pearlean Brownie in the past. Had unremarkable imaging and EEG. Per lab review, lab workup for causes of cognitive decline was unremarkable. Suspect this is likely case of dementia of the Alzheimer's type.   1)Dementia, suspect Alzheimer's type  -suggest starting Namenda. Wife wishes to discuss with PCP -Lexapro is likely giving some benefit but wife does wish to discontinue. Will defer to prescribing physician. I counseled her that he is likely getting some benefit from the medication but there is no contraindication to stopping it -wife  inquired about use of oxytocin and other alternative therapies. Counseled her that these are investigative and suggested she check "clinicaltrials.gov" to see if there are any ongoing trials he may qualify for -follow up in 6 months

## 2013-08-03 NOTE — Patient Instructions (Signed)
Overall you are doing fairly well but I do want to suggest a few things today:   Remember to drink plenty of fluid, eat healthy meals and do not skip any meals. Try to eat protein with a every meal and eat a healthy snack such as fruit or nuts in between meals. Try to keep a regular sleep-wake schedule and try to exercise daily, particularly in the form of walking, 20-30 minutes a day, if you can.   As far as your medications are concerned, I would like to suggest consider starting a medication called Namenda. Please discuss this option with your primary care physician.  From a neurological standpoint, it is ok to taper down off the Lexapro.   Please follow up in 6 months with our NP, sooner if we need to. Please call us with any interim questions, concerns, problems, updates or refill requests.   My clinical assistant and will answer any of your questions and relay your messages to me and also relay most of my messages to you.   Our phone number is [phone]. We also have an after hours call service for urgent matters and there is a physician on-call for urgent questions. For any emergencies you know to call 911 or go to the nearest emergency room

## 2013-08-04 ENCOUNTER — Telehealth: Payer: Self-pay | Admitting: Internal Medicine

## 2013-08-04 NOTE — Telephone Encounter (Signed)
New Problem  Pt's wife called and states that they recieved the monitor for the pacemaker/ / pt is request additional information on how to set up the monitor.. Please assist

## 2013-08-07 ENCOUNTER — Ambulatory Visit (INDEPENDENT_AMBULATORY_CARE_PROVIDER_SITE_OTHER): Payer: Medicare HMO | Admitting: *Deleted

## 2013-08-07 ENCOUNTER — Encounter: Payer: Self-pay | Admitting: Internal Medicine

## 2013-08-07 DIAGNOSIS — Z950 Presence of cardiac pacemaker: Secondary | ICD-10-CM

## 2013-08-07 DIAGNOSIS — I441 Atrioventricular block, second degree: Secondary | ICD-10-CM

## 2013-08-07 NOTE — Telephone Encounter (Signed)
Walked pt's spouse step by step through a transmission process---successfully transmitted on [facility].  Spouse thankful and understands what to do for next transmission.

## 2013-08-17 LAB — REMOTE PACEMAKER DEVICE
AL AMPLITUDE: 2.8 mv
AL THRESHOLD: 0.5 V
BAMS-0001: 150 {beats}/min
BATTERY VOLTAGE: 2.79 V
RV LEAD THRESHOLD: 0.75 V
VENTRICULAR PACING PM: 100

## 2013-08-25 ENCOUNTER — Encounter: Payer: Self-pay | Admitting: *Deleted

## 2013-08-29 ENCOUNTER — Other Ambulatory Visit: Payer: Self-pay | Admitting: *Deleted

## 2013-08-29 MED ORDER — MEMANTINE HCL 10 MG PO TABS: 10.0000 mg | ORAL_TABLET | Freq: Two times a day (BID) | ORAL | Status: DC

## 2013-10-16 ENCOUNTER — Ambulatory Visit (INDEPENDENT_AMBULATORY_CARE_PROVIDER_SITE_OTHER): Payer: Medicare HMO | Admitting: Internal Medicine

## 2013-10-16 ENCOUNTER — Encounter: Payer: Self-pay | Admitting: Internal Medicine

## 2013-10-16 ENCOUNTER — Other Ambulatory Visit: Payer: Self-pay | Admitting: *Deleted

## 2013-10-16 VITALS — BP 136/60 | HR 80 | Temp 98.2°F | Resp 16 | Ht 66.0 in | Wt 177.0 lb

## 2013-10-16 DIAGNOSIS — F0280 Dementia in other diseases classified elsewhere without behavioral disturbance: Secondary | ICD-10-CM

## 2013-10-16 DIAGNOSIS — D649 Anemia, unspecified: Secondary | ICD-10-CM

## 2013-10-16 DIAGNOSIS — I10 Essential (primary) hypertension: Secondary | ICD-10-CM

## 2013-10-16 MED ORDER — METOPROLOL SUCCINATE ER 25 MG PO TB24: 25.0000 mg | ORAL_TABLET | Freq: Every day | ORAL | Status: DC

## 2013-10-16 NOTE — Patient Instructions (Signed)
The patient is instructed to continue all medications as prescribed. Schedule followup with check out clerk upon leaving the clinic  

## 2013-10-16 NOTE — Progress Notes (Signed)
Subjective:    Patient ID: Lucas Khan, male    DOB: 1931/05/31, 79 y.o.   MRN: [id number]  HPI Follow up for memory loss and depression Had been in nursing home and has been home and has noted frequent urination  Follow up for anemia  Review of Systems  Constitutional: Negative for fever and fatigue.  HENT: Negative for congestion, hearing loss and postnasal drip.   Eyes: Negative for discharge, redness and visual disturbance.  Respiratory: Negative for cough, shortness of breath and wheezing.   Cardiovascular: Negative for leg swelling.  Gastrointestinal: Negative for abdominal pain, constipation and abdominal distention.  Genitourinary: Negative for urgency and frequency.  Musculoskeletal: Negative for arthralgias, joint swelling and neck pain.  Skin: Negative for color change and rash.  Neurological: Negative for weakness and light-headedness.  Hematological: Negative for adenopathy.  Psychiatric/Behavioral: Negative for behavioral problems.   Past Medical History  Diagnosis Date  . Hyperlipidemia   . Hypertension   . Memory loss   . Mobitz type 2 second degree atrioventricular block   . Aortic stenosis     Mean gradient 23  6/13  . Pacemaker - Medtronic     DOI-7/13    History   Social History  . Marital Status: Married    Spouse Name: Mireille (Mi Mi)    Number of Children: N/A  . Years of Education: College   Occupational History  . retired    Social History Main Topics  . Smoking status: Former Smoker    Types: Pipe    Quit date: 01/17/2010  . Smokeless tobacco: Never Used  . Alcohol Use: Yes     Comment: 1 glass of wine daily  . Drug Use: No  . Sexual Activity: Not Currently   Other Topics Concern  . Not on file   Social History Narrative   Patient lives at home with his spouse.   Caffeine Use: 2-3 cups daily    Past Surgical History  Procedure Laterality Date  . Appendectomy      Family History  Problem Relation Age of Onset  .  Memory loss      Allergies  Allergen Reactions  . Penicillins     Wife doesn't know what type of reaction  . Amoxicillin     REACTION: unspecified  . Statins     REACTION: unspecified    Current Outpatient Prescriptions on File Prior to Visit  Medication Sig Dispense Refill  . calcium carbonate (OS-CAL) 600 MG TABS Take 600 mg by mouth daily.      . cyanocobalamin 500 MCG tablet Take 500 mcg by mouth daily.      . fish oil-omega-3 fatty acids 1000 MG capsule Take 3 g by mouth daily.       . Ginkgo Biloba 40 MG TABS Take 1 tablet by mouth daily.      . Iron-Vit C-Vit B12-Folic Acid 100-250-0.025-1 MG TABS Take 1 capsule by mouth daily.  30 each  2  . magnesium 30 MG tablet Take 30 mg by mouth 2 (two) times daily.      . memantine (NAMENDA) 10 MG tablet Take 1 tablet (10 mg total) by mouth 2 (two) times daily.  60 tablet  3  . metoprolol succinate (TOPROL-XL) 50 MG 24 hr tablet Take 25 mg by mouth daily. Take with or immediately following a meal.      . Multiple Vitamin (MULTIVITAMIN) tablet Take 1 tablet by mouth daily.      Marland Kitchen  PHOSPHATIDYLSERINE PO Take 1 tablet by mouth daily.       . saw palmetto 500 MG capsule Take 500 mg by mouth daily.       No current facility-administered medications on file prior to visit.    BP 136/60  Pulse 80  Temp(Src) 98.2 F (36.8 C)  Resp 16  Ht 5\' 6"  (1.676 m)  Wt 177 lb (80.287 kg)  BMI 28.58 kg/m2   Past Medical History  Diagnosis Date  . Hyperlipidemia   . Hypertension   . Memory loss   . Mobitz type 2 second degree atrioventricular block   . Aortic stenosis     Mean gradient 23  6/13  . Pacemaker - Medtronic     DOI-7/13    History   Social History  . Marital Status: Married    Spouse Name: Mireille (Mi Mi)    Number of Children: N/A  . Years of Education: College   Occupational History  . retired    Social History Main Topics  . Smoking status: Former Smoker    Types: Pipe    Quit date: 01/17/2010  . Smokeless  tobacco: Never Used  . Alcohol Use: Yes     Comment: 1 glass of wine daily  . Drug Use: No  . Sexual Activity: Not Currently   Other Topics Concern  . Not on file   Social History Narrative   Patient lives at home with his spouse.   Caffeine Use: 2-3 cups daily    Past Surgical History  Procedure Laterality Date  . Appendectomy      Family History  Problem Relation Age of Onset  . Memory loss      Allergies  Allergen Reactions  . Penicillins     Wife doesn't know what type of reaction  . Amoxicillin     REACTION: unspecified  . Statins     REACTION: unspecified    Current Outpatient Prescriptions on File Prior to Visit  Medication Sig Dispense Refill  . calcium carbonate (OS-CAL) 600 MG TABS Take 600 mg by mouth daily.      . cyanocobalamin 500 MCG tablet Take 500 mcg by mouth daily.      . fish oil-omega-3 fatty acids 1000 MG capsule Take 3 g by mouth daily.       . Ginkgo Biloba 40 MG TABS Take 1 tablet by mouth daily.      . Iron-Vit C-Vit B12-Folic Acid 100-250-0.025-1 MG TABS Take 1 capsule by mouth daily.  30 each  2  . magnesium 30 MG tablet Take 30 mg by mouth 2 (two) times daily.      . memantine (NAMENDA) 10 MG tablet Take 1 tablet (10 mg total) by mouth 2 (two) times daily.  60 tablet  3  . Multiple Vitamin (MULTIVITAMIN) tablet Take 1 tablet by mouth daily.      Marland Kitchen PHOSPHATIDYLSERINE PO Take 1 tablet by mouth daily.       . saw palmetto 500 MG capsule Take 500 mg by mouth daily.       No current facility-administered medications on file prior to visit.    BP 136/60  Pulse 80  Temp(Src) 98.2 F (36.8 C)  Resp 16  Ht 5\' 6"  (1.676 m)  Wt 177 lb (80.287 kg)  BMI 28.58 kg/m2       Objective:   Physical Exam  Nursing note and vitals reviewed. Constitutional: He appears well-developed and well-nourished.  HENT:  Head: Normocephalic and atraumatic.  Eyes: Conjunctivae are normal. Pupils are equal, round, and reactive to light.  Neck: Normal  range of motion. Neck supple.  Cardiovascular: Normal rate and regular rhythm.   Murmur heard.  Crescendo systolic murmur is present with a grade of 3/6  AS  Pulmonary/Chest: Effort normal and breath sounds normal.  Abdominal: Soft. Bowel sounds are normal.          Assessment & Plan:  HTN stable Progressive dementia but functioning better with increased nutritional status History of anemia and check iron and cbc

## 2013-10-16 NOTE — Progress Notes (Signed)
Pre visit review using our clinic review tool, if applicable. No additional management support is needed unless otherwise documented below in the visit note. 

## 2013-10-17 LAB — CBC WITH DIFFERENTIAL/PLATELET
Hemoglobin: 7.1 g/dL — CL (ref 13.0–17.0)
MCHC: 29.5 g/dL — ABNORMAL LOW (ref 30.0–36.0)
RBC: 3.89 Mil/uL — ABNORMAL LOW (ref 4.22–5.81)
RDW: 21.6 % — ABNORMAL HIGH (ref 11.5–14.6)

## 2013-10-17 LAB — IRON: Iron: 10 ug/dL — ABNORMAL LOW (ref 42–165)

## 2013-10-18 ENCOUNTER — Other Ambulatory Visit: Payer: Self-pay | Admitting: *Deleted

## 2013-10-23 ENCOUNTER — Other Ambulatory Visit (HOSPITAL_COMMUNITY): Payer: Self-pay | Admitting: Internal Medicine

## 2013-10-23 ENCOUNTER — Ambulatory Visit (HOSPITAL_COMMUNITY)
Admission: RE | Admit: 2013-10-23 | Discharge: 2013-10-23 | Disposition: A | Discharge status: Home / Self Care | Payer: Medicare HMO | Source: Ambulatory Visit | Attending: Internal Medicine | Admitting: Internal Medicine

## 2013-10-23 DIAGNOSIS — D649 Anemia, unspecified: Secondary | ICD-10-CM | POA: Insufficient documentation

## 2013-10-23 LAB — PREPARE RBC (CROSSMATCH)

## 2013-10-23 LAB — ABO/RH: ABO/RH(D): A POS

## 2013-10-24 ENCOUNTER — Encounter (HOSPITAL_COMMUNITY): Payer: Self-pay

## 2013-10-24 ENCOUNTER — Encounter (HOSPITAL_COMMUNITY)
Admission: RE | Admit: 2013-10-24 | Discharge: 2013-10-24 | Disposition: A | Discharge status: Home / Self Care | Payer: Medicare HMO | Source: Ambulatory Visit | Attending: Internal Medicine | Admitting: Internal Medicine

## 2013-10-24 DIAGNOSIS — D649 Anemia, unspecified: Secondary | ICD-10-CM | POA: Insufficient documentation

## 2013-10-24 LAB — PREPARE RBC (CROSSMATCH)

## 2013-10-24 MED ORDER — SODIUM CHLORIDE 0.9 % IV SOLN
Freq: Once | INTRAVENOUS | Status: AC
  Administered 2013-10-24: 20 mL/h via INTRAVENOUS

## 2013-10-24 NOTE — Discharge Instructions (Signed)

## 2013-10-25 LAB — TYPE AND SCREEN
ABO/RH(D): A POS
Antibody Screen: NEGATIVE
UNIT DIVISION: 0
Unit division: 0

## 2013-11-10 ENCOUNTER — Ambulatory Visit (INDEPENDENT_AMBULATORY_CARE_PROVIDER_SITE_OTHER): Payer: Medicare HMO | Admitting: *Deleted

## 2013-11-10 DIAGNOSIS — I441 Atrioventricular block, second degree: Secondary | ICD-10-CM

## 2013-11-10 LAB — MDC_IDC_ENUM_SESS_TYPE_REMOTE
Battery Impedance: 135 Ohm
Battery Voltage: 2.8 V
Brady Statistic AP VP Percent: 39 %
Brady Statistic AP VS Percent: 0 %
Brady Statistic AS VP Percent: 61 %
Lead Channel Impedance Value: 415 Ohm
Lead Channel Pacing Threshold Amplitude: 0.375 V
Lead Channel Sensing Intrinsic Amplitude: 2.8 mV
Lead Channel Setting Pacing Amplitude: 2 V
Lead Channel Setting Pacing Pulse Width: 0.4 ms
MDC IDC MSMT LEADCHNL RA PACING THRESHOLD PULSEWIDTH: 0.4 ms
MDC IDC MSMT LEADCHNL RV IMPEDANCE VALUE: 436 Ohm
MDC IDC MSMT LEADCHNL RV PACING THRESHOLD AMPLITUDE: 0.875 V
MDC IDC MSMT LEADCHNL RV PACING THRESHOLD PULSEWIDTH: 0.4 ms
MDC IDC SESS DTM: 20150123172340
MDC IDC SET LEADCHNL RA PACING AMPLITUDE: 1.5 V
MDC IDC SET LEADCHNL RV SENSING SENSITIVITY: 2 mV
MDC IDC STAT BRADY AS VS PERCENT: 0 %

## 2013-11-28 ENCOUNTER — Encounter: Payer: Self-pay | Admitting: *Deleted

## 2013-12-07 ENCOUNTER — Encounter: Payer: Self-pay | Admitting: Internal Medicine

## 2013-12-18 ENCOUNTER — Other Ambulatory Visit (INDEPENDENT_AMBULATORY_CARE_PROVIDER_SITE_OTHER): Payer: Medicare HMO

## 2013-12-18 DIAGNOSIS — I10 Essential (primary) hypertension: Secondary | ICD-10-CM

## 2013-12-18 DIAGNOSIS — N401 Enlarged prostate with lower urinary tract symptoms: Secondary | ICD-10-CM

## 2013-12-18 DIAGNOSIS — Z0000 Encounter for general adult medical examination without abnormal findings: Secondary | ICD-10-CM

## 2013-12-18 LAB — HEPATIC FUNCTION PANEL
ALT: 13 U/L (ref 0–53)
AST: 19 U/L (ref 0–37)
Albumin: 3.2 g/dL — ABNORMAL LOW (ref 3.5–5.2)
Alkaline Phosphatase: 80 U/L (ref 39–117)
BILIRUBIN DIRECT: 0.1 mg/dL (ref 0.0–0.3)
BILIRUBIN TOTAL: 0.7 mg/dL (ref 0.3–1.2)
Total Protein: 6.8 g/dL (ref 6.0–8.3)

## 2013-12-18 LAB — POCT URINALYSIS DIPSTICK
BILIRUBIN UA: NEGATIVE
GLUCOSE UA: NEGATIVE
Ketones, UA: NEGATIVE
Leukocytes, UA: NEGATIVE
Nitrite, UA: NEGATIVE
PH UA: 6.5
Protein, UA: NEGATIVE
RBC UA: NEGATIVE
SPEC GRAV UA: 1.015
Urobilinogen, UA: 0.2

## 2013-12-18 LAB — CBC WITH DIFFERENTIAL/PLATELET
HCT: 32.4 % — ABNORMAL LOW (ref 39.0–52.0)
HEMOGLOBIN: 9.6 g/dL — AB (ref 13.0–17.0)
MCV: 70.7 fl — AB (ref 78.0–100.0)
Platelets: 428 10*3/uL — ABNORMAL HIGH (ref 150.0–400.0)
RBC: 4.59 Mil/uL (ref 4.22–5.81)
RDW: 27.4 % — ABNORMAL HIGH (ref 11.5–14.6)
WBC: 7.6 10*3/uL (ref 4.5–10.5)

## 2013-12-18 LAB — BASIC METABOLIC PANEL
BUN: 20 mg/dL (ref 6–23)
CHLORIDE: 107 meq/L (ref 96–112)
CO2: 26 meq/L (ref 19–32)
Calcium: 9.4 mg/dL (ref 8.4–10.5)
Creatinine, Ser: 1 mg/dL (ref 0.4–1.5)
GFR: 75.35 mL/min (ref >60.00)
Glucose, Bld: 89 mg/dL (ref 70–99)
POTASSIUM: 5.1 meq/L (ref 3.5–5.1)
SODIUM: 141 meq/L (ref 135–145)

## 2013-12-18 LAB — LIPID PANEL
CHOL/HDL RATIO: 3
Cholesterol: 213 mg/dL — ABNORMAL HIGH (ref 0–200)
HDL: 73.4 mg/dL (ref >39.00)
LDL Cholesterol: 127 mg/dL — ABNORMAL HIGH (ref 0–99)
TRIGLYCERIDES: 63 mg/dL (ref 0.0–149.0)
VLDL: 12.6 mg/dL (ref 0.0–40.0)

## 2013-12-18 LAB — PSA: PSA: 0.2 ng/mL (ref 0.10–4.00)

## 2013-12-18 LAB — TSH: TSH: 2.25 u[IU]/mL (ref 0.35–5.50)

## 2013-12-25 ENCOUNTER — Encounter: Payer: Self-pay | Admitting: Internal Medicine

## 2013-12-25 ENCOUNTER — Ambulatory Visit (INDEPENDENT_AMBULATORY_CARE_PROVIDER_SITE_OTHER): Payer: Medicare HMO | Admitting: Internal Medicine

## 2013-12-25 VITALS — BP 140/70 | HR 90 | Resp 20 | Ht 68.5 in | Wt 170.0 lb

## 2013-12-25 DIAGNOSIS — I10 Essential (primary) hypertension: Secondary | ICD-10-CM

## 2013-12-25 DIAGNOSIS — Z0000 Encounter for general adult medical examination without abnormal findings: Secondary | ICD-10-CM

## 2013-12-25 DIAGNOSIS — Z23 Encounter for immunization: Secondary | ICD-10-CM

## 2013-12-25 DIAGNOSIS — I350 Nonrheumatic aortic (valve) stenosis: Secondary | ICD-10-CM

## 2013-12-25 DIAGNOSIS — I359 Nonrheumatic aortic valve disorder, unspecified: Secondary | ICD-10-CM

## 2013-12-25 NOTE — Progress Notes (Signed)
   Subjective:    Patient ID: Lucas Khan, male    DOB: 10-15-1931, 80 y.o.   MRN: [id number]  HPI Patient is an 80 year old male who presents for an annual wellness examination.  He has moderately severe dementia and recently had done poorly he was actually hospitalized and placed in a nursing home lost significant weight 2 depression and activity.  His wife has taken him home she has reestablished a good nutritional level for the patient . He is responsive follows commands does display short-term memory loss and cannot reverse numbers and words.  He has gained weight his blood pressure is stable he has moderate lower extremity edema without any ulcerations but does have dry skin   Review of Systems  Constitutional: Positive for activity change and appetite change.  HENT: Negative.   Eyes: Negative.   Respiratory: Negative.   Cardiovascular: Negative.   Gastrointestinal: Positive for abdominal distention.  Endocrine: Negative.   Genitourinary: Positive for frequency.       Nocturia x3  Neurological: Positive for speech difficulty and weakness.  Hematological: Negative.   Psychiatric/Behavioral: Positive for decreased concentration.       Objective:   Physical Exam  Nursing note and vitals reviewed. Constitutional: He appears well-developed and well-nourished.  malel pattern balding  HENT:  Head: Normocephalic and atraumatic.  Eyes: Conjunctivae are normal. Pupils are equal, round, and reactive to light.  Neck: Normal range of motion. Neck supple.  Cardiovascular: Normal rate and regular rhythm.   Murmur heard. 1+ edema  Pulmonary/Chest: Effort normal and breath sounds normal.  Abdominal: Soft. Bowel sounds are normal.  Musculoskeletal: He exhibits edema and tenderness.  Neurological: He displays normal reflexes. No cranial nerve deficit. Coordination normal.  Moderate to moderately severe dementia          Assessment & Plan:  Patient presents for yearly  preventative medicine examination. Medicare questionnaire was completed  All immunizations and health maintenance protocols were reviewed with the patient and needed orders were placed.  Appropriate screening laboratory values were ordered for the patient including screening of hyperlipidemia, renal function and hepatic function. If indicated by BPH, a PSA was ordered.  Medication reconciliation,  past medical history, social history, problem list and allergies were reviewed in detail with the patient  Goals were established with regard to weight loss, exercise, and  diet in compliance with medications  End of life planning was discussed.  The best intervention for him at this time is to stay on his current medications and increase his level of activity will recommend walking  Moderate iron deficiency anemia we have discussed with her that we would do an extensive workup for this and due to his overall mental status he would be an appropriate and given the fact that he has had complications from procedures and anesthesia.  We therefore recommended he be on chronic iron and his wife wants to use a bland that she is seen at [employer]

## 2013-12-25 NOTE — Patient Instructions (Signed)
Sure to take an iron supplement every day.  Recommend walking short distances twice daily

## 2013-12-25 NOTE — Progress Notes (Signed)
Pre-visit discussion using our clinic review tool. No additional management support is needed unless otherwise documented below in the visit note.  

## 2013-12-26 ENCOUNTER — Telehealth: Payer: Self-pay | Admitting: Internal Medicine

## 2013-12-26 NOTE — Telephone Encounter (Signed)
Relevant patient education mailed to patient.  

## 2014-02-06 ENCOUNTER — Ambulatory Visit (INDEPENDENT_AMBULATORY_CARE_PROVIDER_SITE_OTHER): Payer: Medicare HMO | Admitting: Nurse Practitioner

## 2014-02-06 ENCOUNTER — Encounter (INDEPENDENT_AMBULATORY_CARE_PROVIDER_SITE_OTHER): Payer: Self-pay

## 2014-02-06 ENCOUNTER — Encounter: Payer: Self-pay | Admitting: Nurse Practitioner

## 2014-02-06 VITALS — BP 133/68 | HR 76 | Ht 68.0 in | Wt 170.0 lb

## 2014-02-06 DIAGNOSIS — F0280 Dementia in other diseases classified elsewhere without behavioral disturbance: Secondary | ICD-10-CM | POA: Insufficient documentation

## 2014-02-06 DIAGNOSIS — G309 Alzheimer's disease, unspecified: Principal | ICD-10-CM

## 2014-02-06 NOTE — Patient Instructions (Addendum)
Patients memory score suggestive of moderate memory loss.  Given copy of most recent CT of the head Important to drink plenty of fluids.  Small frequent meals No follow up planned

## 2014-02-06 NOTE — Progress Notes (Signed)
[facility]  PATIENT: Lucas MassonWilly L Sauseda DOB: 1930/11/17   REASON FOR VISIT: for memory loss   HISTORY OF PRESENT ILLNESS:Mr. Everette RankVerbelen, 80 year old male returns for followup with wife. He was seen by Dr. Hosie PoissonSumner 08/03/2013. He has dementia which is suspicious for Alzheimer's disease. Namenda was suggested at his last visit but the wife would prefer alternative therapies,  he was not placed on the drug. He has been tapered off of his Lexapro by Dr. Lovell SheehanJenkins. He continues to have short-term memory problems. He has good and bad days. He can feed himself and assistance with dressing bathing etc. or at least standby assist. He is no longer physically active as he has been in the past. He is currently living at home. There has been no hallucinations or agitation. He returns for reevaluation   HISTORY: cognitive decline  Evaluated by Dr Pearlean BrownieSethi in 2007 for cognitive decline, was initially prescribed Aricept, wife said it was stopped in 2010. Wife notes that it was causing trouble with his heart, she feels it led to him requiring a PPM. At that time he had a MRI of the brain and an EEG. He is currently taking Huperizine A and phosphatidylserine. Wife thinks these medications are helping him.  Had a fall a few months ago, went to hospital and rehab facility. At that time they started him on Lexapro which she thinks causes him to feel worse. Wife is concerned that he is declining. He has difficulty with translating, decreased energy, decreased motivation and energy. No focal weakness. She denies any hallucinations or agitation.    REVIEW OF SYSTEMS: Full 14 system review of systems performed and notable only for those listed, all others are neg:  Constitutional: Fatigue Cardiovascular: N/A  Ear/Nose/Throat: N/A  Skin: N/A  Eyes: N/A  Respiratory: Shortness of breath  Gastroitestinal: Urinary frequency  Hematology/Lymphatic: N/A  Endocrine: N/A Musculoskeletal:N/A    Allergy/Immunology: Environmental allergies  Neurological: Memory loss Psychiatric: Confusion  ALLERGIES: Allergies  Allergen Reactions  . Penicillins     Wife doesn't know what type of reaction  . Amoxicillin     REACTION: unspecified  . Statins     REACTION: unspecified    HOME MEDICATIONS: Outpatient Prescriptions Prior to Visit  Medication Sig Dispense Refill  . calcium carbonate (OS-CAL) 600 MG TABS Take 600 mg by mouth daily.      . cyanocobalamin 500 MCG tablet Take 500 mcg by mouth daily.      . fish oil-omega-3 fatty acids 1000 MG capsule Take 3 g by mouth daily.       . Ginkgo Biloba 40 MG TABS Take 1 tablet by mouth daily.      . Iron-Vit C-Vit B12-Folic Acid 100-250-0.025-1 MG TABS Take 1 capsule by mouth daily.  30 each  2  . magnesium 30 MG tablet Take 30 mg by mouth 2 (two) times daily.      . metoprolol succinate (TOPROL-XL) 25 MG 24 hr tablet Take 1 tablet (25 mg total) by mouth daily. Take with or immediately following a meal.  30 tablet  11  . Multiple Vitamin (MULTIVITAMIN) tablet Take 1 tablet by mouth daily.      Marland Kitchen. PHOSPHATIDYLSERINE PO Take 1 tablet by mouth daily.       . saw palmetto 500 MG capsule Take 500 mg by mouth daily.       No facility-administered medications prior to visit.    PAST MEDICAL HISTORY: Past Medical History  Diagnosis Date  . Hyperlipidemia   .  Hypertension   . Memory loss   . Mobitz type 2 second degree atrioventricular block   . Aortic stenosis     Mean gradient 23  6/13  . Pacemaker - Medtronic     DOI-7/13    PAST SURGICAL HISTORY: Past Surgical History  Procedure Laterality Date  . Appendectomy      FAMILY HISTORY: Family History  Problem Relation Age of Onset  . Memory loss      SOCIAL HISTORY: History   Social History  . Marital Status: Married    Spouse Name: Mireille (Mi Mi)    Number of Children: N/A  . Years of Education: College   Occupational History  . retired    Social History Main  Topics  . Smoking status: Former Smoker    Types: Pipe    Quit date: 01/17/2010  . Smokeless tobacco: Never Used  . Alcohol Use: Yes     Comment: 1 glass of wine daily  . Drug Use: No  . Sexual Activity: Not Currently   Other Topics Concern  . Not on file   Social History Narrative   Patient lives at home with his spouse.   Caffeine Use: 2-3 cups daily     PHYSICAL EXAM  Filed Vitals:   02/06/14 1420  BP: 133/68  Pulse: 76  Height: 5\' 8"  (1.727 m)  Weight: 170 lb (77.111 kg)   Body mass index is 25.85 kg/(m^2).  Generalized: Well developed, in no acute distress , well groomed Head: normocephalic and atraumatic,. Oropharynx benign  Neck: Supple, no carotid bruits  Cardiac: Regular rate rhythm, systolic  murmur  Musculoskeletal: No deformity   Neurological examination   Mentation: Alert,  MOCA 16/30 missing short-term items, naming common items, serial sevens and copying  a figure. Follows all commands speech and language fluent  Cranial nerve II-XII: Pupils were equal round reactive to light extraocular movements were full, visual field were full on confrontational test. Facial sensation and strength were normal. hearing was intact to finger rubbing bilaterally. Uvula tongue midline. head turning and shoulder shrug were normal and symmetric.Tongue protrusion into cheek strength was normal. Motor: normal bulk and tone, full strength in the BUE, BLE,  No focal weakness Coordination: finger-nose-finger, no dysmetria Reflexes: Depressed  upper and lower, plantar responses were flexor bilaterally. Gait and Station: Rising up from seated position without assistance, stooped  stance,  shortened stride gait is mildly unsteady, no assistive device  DIAGNOSTIC DATA (LABS, IMAGING, TESTING) - I reviewed patient records, labs, notes, testing and imaging myself where available.  Lab Results  Component Value Date   WBC 7.6 12/18/2013   HGB 9.6* 12/18/2013   HCT 32.4* 12/18/2013   MCV  70.7* 12/18/2013   PLT 428.0* 12/18/2013      Component Value Date/Time   NA 141 12/18/2013 1026   K 5.1 12/18/2013 1026   CL 107 12/18/2013 1026   CO2 26 12/18/2013 1026   GLUCOSE 89 12/18/2013 1026   BUN 20 12/18/2013 1026   CREATININE 1.0 12/18/2013 1026   CALCIUM 9.4 12/18/2013 1026   PROT 6.8 12/18/2013 1026   ALBUMIN 3.2* 12/18/2013 1026   AST 19 12/18/2013 1026   ALT 13 12/18/2013 1026   ALKPHOS 80 12/18/2013 1026   BILITOT 0.7 12/18/2013 1026   GFRNONAA >90 03/10/2013 0620   GFRAA >90 03/10/2013 0620   Lab Results  Component Value Date   CHOL 213* 12/18/2013   HDL 73.40 12/18/2013   LDLCALC 127* 12/18/2013  LDLDIRECT 165.1 11/08/2012   TRIG 63.0 12/18/2013   CHOLHDL 3 12/18/2013   Lab Results  Component Value Date   HGBA1C 6.3* 03/09/2013   Lab Results  Component Value Date   VITAMINB12 829 03/09/2013   Lab Results  Component Value Date   TSH 2.25 12/18/2013      ASSESSMENT AND PLAN  80 y.o. year old male  has a past medical history of Hyperlipidemia; Hypertension; Memory loss; Mobitz type 2 second degree atrioventricular block; Aortic stenosis; and Pacemaker -here to followup for memory loss. He has had side effects to Aricept in the past and the wife did not want to put him on Namenda as Dr. Hosie Poisson had suggested. She wants to try alternative therapies. This is a patient of Dr. Hosie Poisson who is out of the office today  Patients memory score suggestive of moderate memory loss.  Given copy of most recent CT of the head which shows brain atrophy Important to drink plenty of fluids.  Small frequent meals No follow up planned Nilda Riggs, [facility], [facility], APRN  [facility] [address], [address] [place], [place] [address] [phone]

## 2014-02-06 NOTE — Progress Notes (Signed)
I agree with the assessment and plan as directed by NP .The patient is known to me .   Caliya Narine, MD  

## 2014-02-07 NOTE — Progress Notes (Signed)
I agree with the assessment and plan as directed by NP .The patient is known to me .   Launa Goedken, MD  

## 2014-02-12 ENCOUNTER — Ambulatory Visit (INDEPENDENT_AMBULATORY_CARE_PROVIDER_SITE_OTHER): Payer: Medicare HMO | Admitting: *Deleted

## 2014-02-12 DIAGNOSIS — I442 Atrioventricular block, complete: Secondary | ICD-10-CM

## 2014-02-13 ENCOUNTER — Telehealth: Payer: Self-pay | Admitting: Internal Medicine

## 2014-02-13 LAB — MDC_IDC_ENUM_SESS_TYPE_REMOTE
Battery Impedance: 159 Ohm
Battery Remaining Longevity: 129 mo
Brady Statistic AP VS Percent: 0 %
Brady Statistic AS VS Percent: 0 %
Date Time Interrogation Session: 20150428160132
Lead Channel Impedance Value: 443 Ohm
Lead Channel Pacing Threshold Amplitude: 0.875 V
Lead Channel Pacing Threshold Pulse Width: 0.4 ms
Lead Channel Pacing Threshold Pulse Width: 0.4 ms
Lead Channel Setting Pacing Amplitude: 1.5 V
Lead Channel Setting Pacing Amplitude: 2 V
Lead Channel Setting Sensing Sensitivity: 2 mV
MDC IDC MSMT BATTERY VOLTAGE: 2.8 V
MDC IDC MSMT LEADCHNL RA IMPEDANCE VALUE: 414 Ohm
MDC IDC MSMT LEADCHNL RA PACING THRESHOLD AMPLITUDE: 0.5 V
MDC IDC MSMT LEADCHNL RA SENSING INTR AMPL: 2.8 mV
MDC IDC SET LEADCHNL RV PACING PULSEWIDTH: 0.4 ms
MDC IDC STAT BRADY AP VP PERCENT: 37 %
MDC IDC STAT BRADY AS VP PERCENT: 63 %

## 2014-02-13 NOTE — Telephone Encounter (Signed)
New Message  Pt called states he missed remote check for 02/12/2014. Pt will send remote check today. Please call back if needed//SR

## 2014-02-19 NOTE — Telephone Encounter (Signed)
Remote was received 02/13/14.

## 2014-02-19 NOTE — Telephone Encounter (Signed)
New message'  Pt requests a call back to discuss if the remote check was received. Please call back.

## 2014-03-02 ENCOUNTER — Encounter: Payer: Self-pay | Admitting: Cardiology

## 2014-03-06 ENCOUNTER — Encounter: Payer: Self-pay | Admitting: Internal Medicine

## 2014-03-22 ENCOUNTER — Telehealth: Payer: Self-pay

## 2014-03-22 NOTE — Telephone Encounter (Signed)
Pt's spouse called and wants to know if patient can be scheduled with Dr. Lovell Sheehan.  Advised pt that Dr. Lovell Sheehan' schedule is limited and that another physician in the office could see pt. Pt's spouse would like the message to be sent to Dr. Lovell Sheehan anyway.  Pt will be gone at 9:30 and will return at 11:30. Pt is having abdominal pain; denies nausea and vomiting and wants to stay in the bed at all times.  Pls advise.

## 2014-03-23 ENCOUNTER — Encounter: Payer: Self-pay | Admitting: Physician Assistant

## 2014-03-23 ENCOUNTER — Ambulatory Visit (INDEPENDENT_AMBULATORY_CARE_PROVIDER_SITE_OTHER): Payer: Medicare HMO | Admitting: Physician Assistant

## 2014-03-23 VITALS — BP 122/70 | HR 85 | Temp 98.6°F | Resp 18 | Wt 158.0 lb

## 2014-03-23 DIAGNOSIS — R0602 Shortness of breath: Secondary | ICD-10-CM

## 2014-03-23 DIAGNOSIS — D509 Iron deficiency anemia, unspecified: Secondary | ICD-10-CM

## 2014-03-23 DIAGNOSIS — R109 Unspecified abdominal pain: Secondary | ICD-10-CM

## 2014-03-23 MED ORDER — METOPROLOL SUCCINATE ER 25 MG PO TB24: 12.5000 mg | ORAL_TABLET | Freq: Every day | ORAL | Status: DC

## 2014-03-23 NOTE — Telephone Encounter (Signed)
Pt will see Susy Frizzle, told pt Dr Lovell Sheehan is here this afternoon to consult with [facility] if needed

## 2014-03-23 NOTE — Progress Notes (Signed)
Pre visit review using our clinic review tool, if applicable. No additional management support is needed unless otherwise documented below in the visit note. 

## 2014-03-23 NOTE — Progress Notes (Signed)
Subjective:    Patient ID: Lucas Khan, male    DOB: April 25, 1931, 80 y.o.   MRN: [id number]  HPI Pt is 80 y/o caucasian male presenting to clinic for abdominal pain, IDA, SOB.  Abdominal pain: This is a mild pain, occuring daily. Pt believes this is coming from taking Metoprolol. Pt wife agrees. Pt is fine, until after taking metoprolol dose. They have even tried taking at different times of day, pt still only has abd pain after taking metoprolol. They have also tried taking half tab instead, which does not cause him to have the abd pain. He admits constipation from Iron supplement, take magnesium to relieve with moderate benefit. He denies tenderness to palpation, reflux, diarrhea, blood in stool, melena.   IDA: Pt last Hgb was 9.6. Has required blood transfusion in the past. Has been on iron supplement for over a year. Major side effect is severe constipation, relieved by taking magnesium supplement. Pt requesting to be referred for Iron Infusion instead of oral supplement.  SOB: Pt gets SOB anytime he stands from chair, or has to walk around much. Pt has AS, mild to moderate by last echo. Pt had normal CXR last year. Pt has no history of CHF. Pt denies fevers, chills, chest pain, headache, syncope.    Review of Systems  Constitutional: Positive for fatigue. Negative for fever and chills.  Respiratory: Positive for shortness of breath. Negative for apnea, cough and wheezing.   Cardiovascular: Negative for chest pain, palpitations and leg swelling.  Gastrointestinal: Positive for abdominal pain and constipation. Negative for nausea, vomiting, diarrhea and blood in stool.  Neurological: Negative for dizziness, syncope, light-headedness, numbness and headaches.  All other systems reviewed and are negative.    Past Medical History  Diagnosis Date  . Hyperlipidemia   . Hypertension   . Memory loss   . Mobitz type 2 second degree atrioventricular block   . Aortic stenosis     Mean  gradient 23  6/13  . Pacemaker - Medtronic     DOI-7/13   Past Surgical History  Procedure Laterality Date  . Appendectomy      reports that he quit smoking about 4 years ago. His smoking use included Pipe. He has never used smokeless tobacco. He reports that he drinks alcohol. He reports that he does not use illicit drugs. family history includes Memory loss in an other family member. Allergies  Allergen Reactions  . Penicillins     Wife doesn't know what type of reaction  . Amoxicillin     REACTION: unspecified  . Statins     REACTION: unspecified     The PFS History was reviewed with Pt and spouse at time of visit. No changes.    Objective:   Physical Exam  Nursing note and vitals reviewed. Constitutional: He is oriented to person, place, and time. He appears well-developed and well-nourished. No distress.  HENT:  Head: Normocephalic and atraumatic.  Eyes: Conjunctivae and EOM are normal. Pupils are equal, round, and reactive to light.  Neck: Normal range of motion. Neck supple. No JVD present.  Cardiovascular: Normal rate, regular rhythm and intact distal pulses.  Exam reveals no gallop and no friction rub.   Murmur heard. Pulmonary/Chest: Effort normal and breath sounds normal. No stridor. No respiratory distress. He has no wheezes. He has no rales. He exhibits no tenderness.  Musculoskeletal: Normal range of motion. He exhibits no edema and no tenderness.  Lymphadenopathy:    He has no cervical  adenopathy.  Neurological: He is alert and oriented to person, place, and time.  Skin: Skin is warm and dry. No rash noted. He is not diaphoretic. No erythema. There is pallor.  Psychiatric: He has a normal mood and affect. His behavior is normal. Judgment and thought content normal.    Filed Vitals:   03/23/14 1604  BP: 122/70  Pulse: 85  Temp: 98.6 F (37 C)  TempSrc: Oral  Resp: 18  Weight: 158 lb (71.668 kg)  SpO2: 99%    Lab Results  Component Value Date   WBC  7.6 12/18/2013   HGB 9.6* 12/18/2013   HCT 32.4* 12/18/2013   PLT 428.0* 12/18/2013   GLUCOSE 89 12/18/2013   CHOL 213* 12/18/2013   TRIG 63.0 12/18/2013   HDL 73.40 12/18/2013   LDLDIRECT 165.1 11/08/2012   LDLCALC 127* 12/18/2013   ALT 13 12/18/2013   AST 19 12/18/2013   NA 141 12/18/2013   K 5.1 12/18/2013   CL 107 12/18/2013   CREATININE 1.0 12/18/2013   BUN 20 12/18/2013   CO2 26 12/18/2013   TSH 2.25 12/18/2013   PSA 0.20 12/18/2013   INR 1.05 03/09/2013   HGBA1C 6.3* 03/09/2013        Assessment & Plan:  Lucas Khan was seen today for abdominal pain.  Diagnoses and associated orders for this visit:  SOB (shortness of breath) Comments: Stable SpO2 despite SOB. Normal CXR last year. Has AS, echo in 2013 showed mild to moderate AS. Will check BNP to rule out CHF. - Brain Natriuretic Peptide  Abdominal pain Comments: Related to Toprol? Doesn't experience with half dose. Will change Rx to half dose. Also will discontinue many of his supplements that could be causative. - metoprolol succinate (TOPROL-XL) 25 MG 24 hr tablet; Take 0.5 tablets (12.5 mg total) by mouth daily. Take with or immediately following a meal.  IDA (iron deficiency anemia) Comments: Hgb 9.6 in march. Not tolerating iron supplement well (constipation). Will refer to hematology, pt requesting iron infusion. Stop Ginkgo Biloba (bleeding risk). - Ambulatory referral to Hematology   Return precaution provided and SOB handout.  Plan to follow up in about 1 month with PCP to reassess, or sooner for persistent or worsening symptoms.   Patient Instructions  Start taking half of your Metoprolol (Toprol-XL) daily instead. (That will make 12.5mg  total daily dose)  Stop taking your calcium, and try to focus on getting enough through your diet, we will recheck your levels in 6 months.  Stop taking your fish oil supplement as it can cause you to have abdominal discomfort as well  Ginkgo Biloba has been shown to cause bleeding, and unfortunately has  not shown very consistent help with memory loss. You should not be taking this at all seeing as you are anemic, and even a little bleeding could land you in the hospital.  You can continue to take [facility] if you desire.  You will be called to schedule an appointment with a hematologist to discuss receiving Iron infusion since the iron supplements are giving you such bad constipation.  You will have blood work drawn today to check for heart failure, we will call you with these results.  Follow up in about 1 month with your PCP, or sooner for worsening or persistent symptoms.

## 2014-03-23 NOTE — Patient Instructions (Addendum)
Start taking half of your Metoprolol (Toprol-XL) daily instead. (That will make 12.5mg  total daily dose)  Stop taking your calcium, and try to focus on getting enough through your diet, we will recheck your levels in 6 months.  Stop taking your fish oil supplement as it can cause you to have abdominal discomfort as well  Ginkgo Biloba has been shown to cause bleeding, and unfortunately has not shown very consistent help with memory loss. You should not be taking this at all seeing as you are anemic, and even a little bleeding could land you in the hospital.  You can continue to take [facility] if you desire.  You will be called to schedule an appointment with a hematologist to discuss receiving Iron infusion since the iron supplements are giving you such bad constipation.  You will have blood work drawn today to check for heart failure, we will call you with these results.  Follow up in about 1 month with your PCP, or sooner for worsening or persistent symptoms.    Shortness of Breath Shortness of breath means you have trouble breathing. Shortness of breath needs medical care right away. HOME CARE   Do not smoke.  Avoid being around chemicals or things (paint fumes, dust) that may bother your breathing.  Rest as needed. Slowly begin your normal activities.  Only take medicines as told by your doctor.  Keep all doctor visits as told. GET HELP RIGHT AWAY IF:   Your shortness of breath gets worse.  You feel lightheaded, pass out (faint), or have a cough that is not helped by medicine.  You cough up blood.  You have pain with breathing.  You have pain in your chest, arms, shoulders, or belly (abdomen).  You have a fever.  You cannot walk up stairs or exercise the way you normally do.  You do not get better in the time expected.  You have a hard time doing normal activities even with rest.  You have problems with your medicines.  You have any new symptoms. MAKE SURE  YOU:  Understand these instructions.  Will watch your condition.  Will get help right away if you are not doing well or get worse. Document Released: 03/23/2008 Document Revised: 04/05/2012 Document Reviewed: 12/21/2011 [facility] Patient Information 2014 [place], [place].

## 2014-03-24 LAB — BRAIN NATRIURETIC PEPTIDE: BRAIN NATRIURETIC PEPTIDE: 134.1 pg/mL — AB (ref 0.0–100.0)

## 2014-03-26 ENCOUNTER — Telehealth: Payer: Self-pay

## 2014-03-26 NOTE — Telephone Encounter (Signed)
Pt called about lab results. Delivered lab results to Pt wife (caretaker). She understood the results. Says they have appointment with hematology scheduled. Plan remains the same.

## 2014-03-26 NOTE — Telephone Encounter (Signed)
Pt's wife called and states they were referred to Oncology.  Pt's wife has additonal questions for Molli Hazard regarding the referral.  Pt's wife would not go into detail with me but request a call back from [place]. Pls advise.

## 2014-04-03 ENCOUNTER — Telehealth: Payer: Self-pay | Admitting: *Deleted

## 2014-04-03 NOTE — Telephone Encounter (Signed)
Wife called to confirm pt's new pt appts for 6/24.  Instructed her for pt to arrive at 10:45 to register and then will see Dr. Bertis RuddyGorsuch.  She verbalized understanding.

## 2014-04-04 ENCOUNTER — Telehealth: Payer: Self-pay | Admitting: Internal Medicine

## 2014-04-04 NOTE — Telephone Encounter (Signed)
Pt's wife is calling in regards to pt rx lexapro, states pt was prescribed the rx when he was in the rehab center, pt has not taken the rx in over a yr. Wife states he has gotten in a depressed mode again and wife states he is needing to get back on the lexapro because it really helps him. Would like for dr. Lovell SheehanJenkins to write him an rx and send to [facility] on [address].

## 2014-04-09 NOTE — Telephone Encounter (Signed)
Dr Lovell SheehanJenkins is out of the office till Friday.  She should get him in to see another Xavier Fournier is she can't wait till then

## 2014-04-11 ENCOUNTER — Ambulatory Visit: Payer: Medicare HMO | Admitting: Hematology and Oncology

## 2014-04-11 ENCOUNTER — Encounter: Payer: Self-pay | Admitting: Hematology and Oncology

## 2014-04-11 ENCOUNTER — Ambulatory Visit (HOSPITAL_BASED_OUTPATIENT_CLINIC_OR_DEPARTMENT_OTHER): Payer: Medicare HMO | Admitting: Hematology and Oncology

## 2014-04-11 ENCOUNTER — Ambulatory Visit: Payer: Medicare HMO

## 2014-04-11 VITALS — BP 107/60 | HR 74 | Temp 97.6°F | Resp 16 | Ht 68.0 in | Wt 154.9 lb

## 2014-04-11 DIAGNOSIS — I10 Essential (primary) hypertension: Secondary | ICD-10-CM

## 2014-04-11 DIAGNOSIS — R413 Other amnesia: Secondary | ICD-10-CM

## 2014-04-11 DIAGNOSIS — D509 Iron deficiency anemia, unspecified: Secondary | ICD-10-CM

## 2014-04-11 NOTE — Progress Notes (Signed)
[facility] CONSULT NOTE  Patient Care Team: Stacie GlazeJohn E Jenkins, MD as PCP - General  CHIEF COMPLAINTS/PURPOSE OF CONSULTATION:  Iron deficiency anemia HISTORY OF PRESENTING ILLNESS:  Lucas Khan 80 y.o. male is here because of diagnosis of iron deficiency anemia. The patient recently required blood transfusion and was hospitalized due to weakness/fall. The patient is demented. History was obtained through his wife as well as extensive chart review. His latest CBC dated 3 months ago showed microcytic anemia with mild thrombocytosis. There is no reported recent chest pain on exertion, shortness of breath on minimal exertion, pre-syncopal episodes, or palpitations. His wife had not noticed any recent bleeding such as epistaxis, hematuria or hematochezia There is no over the counter NSAID ingestion. He is not on antiplatelets agents. His last colonoscopy was many the years ago. He had no prior history or diagnosis of cancer.  His wife reported that the patient has very poor intake. She has to force him to eat regular meals and even with that, the patient is noncompliance.   MEDICAL HISTORY:  Past Medical History  Diagnosis Date  . Hyperlipidemia   . Hypertension   . Memory loss   . Mobitz type 2 second degree atrioventricular block   . Aortic stenosis     Mean gradient 23  6/13  . Pacemaker - Medtronic     DOI-7/13    SURGICAL HISTORY: Past Surgical History  Procedure Laterality Date  . Appendectomy      SOCIAL HISTORY: History   Social History  . Marital Status: Married    Spouse Name: Mireille ([place])    Number of Children: N/A  . Years of Education: College   Occupational History  . retired    Social History Main Topics  . Smoking status: Former Smoker    Types: Pipe    Quit date: 01/17/2010  . Smokeless tobacco: Never Used  . Alcohol Use: Yes     Comment: 1 glass of wine daily  . Drug Use: No  . Sexual Activity: Not Currently   Other  Topics Concern  . Not on file   Social History Narrative   Patient lives at home with his spouse.   Caffeine Use: 2-3 cups daily    FAMILY HISTORY: Family History  Problem Relation Age of Onset  . Memory loss      ALLERGIES:  is allergic to penicillins; amoxicillin; and statins.  MEDICATIONS:  Current Outpatient Prescriptions  Medication Sig Dispense Refill  . cyanocobalamin 500 MCG tablet Take 500 mcg by mouth daily.      . fish oil-omega-3 fatty acids 1000 MG capsule Take 3 g by mouth daily.       . magnesium 30 MG tablet Take 30 mg by mouth 2 (two) times daily.      . metoprolol succinate (TOPROL-XL) 25 MG 24 hr tablet Take 0.5 tablets (12.5 mg total) by mouth daily. Take with or immediately following a meal.  30 tablet  11  . Multiple Vitamin (MULTIVITAMIN) tablet Take 1 tablet by mouth daily.      Marland Kitchen. PHOSPHATIDYLSERINE PO Take 1 tablet by mouth daily.       . saw palmetto 500 MG capsule Take 500 mg by mouth daily.      . calcium carbonate (OS-CAL) 600 MG TABS Take 600 mg by mouth daily.      . Iron-Vit C-Vit B12-Folic Acid 100-250-0.025-1 MG TABS Take 1 capsule by mouth daily.  30 each  2  No current facility-administered medications for this visit.    REVIEW OF SYSTEMS:   Constitutional: Denies fevers, chills or abnormal night sweats Eyes: Denies blurriness of vision, double vision or watery eyes Ears, nose, mouth, throat, and face: Denies mucositis or sore throat Respiratory: Denies cough, dyspnea or wheezes Cardiovascular: Denies palpitation, chest discomfort or lower extremity swelling Gastrointestinal:  Denies nausea, heartburn or change in bowel habits Skin: Denies abnormal skin rashes Lymphatics: Denies new lymphadenopathy or easy bruising Neurological:Denies numbness, tingling or new weaknesses Behavioral/Psych: Mood is stable, no new changes  All other systems were reviewed with the patient and are negative.  PHYSICAL EXAMINATION: ECOG PERFORMANCE STATUS: 3  - Symptomatic, >50% confined to bed  Filed Vitals:   04/11/14 1102  BP: 107/60  Pulse: 74  Temp: 97.6 F (36.4 C)  Resp: 16   Filed Weights   04/11/14 1102  Weight: 154 lb 14.4 oz (70.262 kg)    GENERAL:alert, no distress and comfortable SKIN: skin color is pale, texture, turgor are normal, no rashes or significant lesions EYES: normal, conjunctiva are pale and non-injected, sclera clear OROPHARYNX:no exudate, no erythema and lips, buccal mucosa, and tongue normal  NECK: supple, thyroid normal size, non-tender, without nodularity LYMPH:  no palpable lymphadenopathy in the cervical, axillary or inguinal LUNGS: clear to auscultation and percussion with normal breathing effort HEART: regular rate & rhythm and no murmurs and no lower extremity edema ABDOMEN:abdomen soft, non-tender and normal bowel sounds Musculoskeletal:no cyanosis of digits and no clubbing  PSYCH: alert, nonverbal. NEURO: no focal motor/sensory deficits  LABORATORY DATA:  I have reviewed the data as listed Recent Results (from the past 2160 hour(s))  MDC_IDC_ENUM_SESS_TYPE_REMOTE     Status: None   Collection Time    02/13/14  4:01 PM      Result Value Ref Range   Date Time Interrogation Session [id number]     Pulse Generator Manufacturer Medtronic     Pulse Gen Model [id number] Adapta     Pulse Gen Serial Number [id number]     RV Sense Sensitivity 2     RA Pace Amplitude 1.5     RV Pace PulseWidth 0.4     RV Pace Amplitude 2     RA Impedance 414     RA Amplitude 2.8     RA Pacing Amplitude 0.50     RA Pacing PulseWidth 0.40     RV IMPEDANCE 443     RV Pacing Amplitude 0.875     RV Pacing PulseWidth 0.40     Battery Status Unknown     Battery Longevity 129     Battery Voltage 2.8     Battery Impedance 159     Brady AP VP Percent 37     Brady AS VP Percent 63     Brady AP VS Percent 0     Brady AS VS Percent 0     Eval Rhythm AS/VP     Miscellaneous Comment       Value: Pacemaker remote check.  Device function reviewed. Impedance, sensing, auto capture thresholds consistent with previous measurements. Histograms appropriate for patient and level of activity. All other diagnostic data reviewed and is appropriate and      stable for patient. Real time/magnet EGM shows appropriate sensing and capture. 80 mode switches--longest was 8 minutes 38 seconds. 2 ventricular high rate episodes--longest was 5 beats. Estimated longevity 10.5 years. ROV 05-09-14 @ 1515 with SK.  BRAIN NATRIURETIC PEPTIDE     Status: Abnormal  Collection Time    03/23/14  5:16 PM      Result Value Ref Range   Brain Natriuretic Peptide 134.1 (*) 0.0 - 100.0 pg/mL   ASSESSMENT & PLAN:  Iron deficiency anemia, unspecified This patient likely has chronic GI bleed until proven otherwise. It was a difficult discussion with his wife as the patient is demented and his wife is the dedicated medical healthcare power of attorney. I explained to her that without further investigations with EGD or colonoscopy, I cannot completely exclude possibility of ongoing GI bleed or GI malignancy. With his significant debility, iron infusion is of doubtful benefit and it would be impossible to monitor for potential infusion reactions. Most patients actually feel more fatigue after iron infusion before they feel better. Blood transfusions would be the only option to improve his blood count but it is unlikely to bring significant contribution to quality of life. His current estimated ECOG performance status score is 3. I asked his wife many times regarding hospice care. She find this unacceptable and almost felt like she is giving up on him by enrolling him to hospice. After a very prolonged discussion, ultimately, the patient's wife is in agreement not to pursue additional workup or treatment.   SYMPTOM, MEMORY LOSS This is a very significant. The patient has advanced dementia. I recommend consideration for hospice.  HYPERTENSION His wife  is very concerned about his borderline low blood pressure. Recommended she consult with his cardiologist before discontinuation of beta blocker.      All questions were answered. The patient knows to call the clinic with any problems, questions or concerns. I spent 55 minutes counseling the patient face to face. The total time spent in the appointment was 60 minutes and more than 50% was on counseling.     [facility], Kurtiss Wence, MD 04/11/2014 8:56 PM

## 2014-04-11 NOTE — Assessment & Plan Note (Signed)
This is a very significant. The patient has advanced dementia. I recommend consideration for hospice.

## 2014-04-11 NOTE — Assessment & Plan Note (Signed)
This patient likely has chronic GI bleed until proven otherwise. It was a difficult discussion with his wife as the patient is demented and his wife is the dedicated medical healthcare power of attorney. I explained to her that without further investigations with EGD or colonoscopy, I cannot completely exclude possibility of ongoing GI bleed or GI malignancy. With his significant debility, iron infusion is of doubtful benefit and it would be impossible to monitor for potential infusion reactions. Most patients actually feel more fatigue after iron infusion before they feel better. Blood transfusions would be the only option to improve his blood count but it is unlikely to bring significant contribution to quality of life. His current estimated ECOG performance status score is 3. I asked his wife many times regarding hospice care. She find this unacceptable and almost felt like she is giving up on him by enrolling him to hospice. After a very prolonged discussion, ultimately, the patient's wife is in agreement not to pursue additional workup or treatment.

## 2014-04-11 NOTE — Progress Notes (Signed)
Checked in new pt with no financial concerns. °

## 2014-04-11 NOTE — Assessment & Plan Note (Signed)
His wife is very concerned about his borderline low blood pressure. Recommended she consult with his cardiologist before discontinuation of beta blocker.

## 2014-04-19 ENCOUNTER — Telehealth: Payer: Self-pay | Admitting: Internal Medicine

## 2014-04-19 NOTE — Telephone Encounter (Signed)
lmovm to c/b and est with Durene CalHunter in September

## 2014-04-24 ENCOUNTER — Other Ambulatory Visit: Payer: Self-pay | Admitting: Cardiovascular Disease

## 2014-04-24 ENCOUNTER — Ambulatory Visit
Admission: RE | Admit: 2014-04-24 | Discharge: 2014-04-24 | Disposition: A | Discharge status: Home / Self Care | Payer: Medicare HMO | Source: Ambulatory Visit | Attending: Cardiovascular Disease | Admitting: Cardiovascular Disease

## 2014-04-24 DIAGNOSIS — R1084 Generalized abdominal pain: Secondary | ICD-10-CM

## 2014-04-26 ENCOUNTER — Encounter: Payer: Self-pay | Admitting: Physician Assistant

## 2014-04-26 ENCOUNTER — Ambulatory Visit (INDEPENDENT_AMBULATORY_CARE_PROVIDER_SITE_OTHER): Payer: Medicare HMO | Admitting: Physician Assistant

## 2014-04-26 VITALS — BP 108/60 | HR 60 | Temp 97.5°F | Resp 18

## 2014-04-26 DIAGNOSIS — D509 Iron deficiency anemia, unspecified: Secondary | ICD-10-CM

## 2014-04-26 DIAGNOSIS — R0602 Shortness of breath: Secondary | ICD-10-CM

## 2014-04-26 NOTE — Progress Notes (Signed)
Pre visit review using our clinic review tool, if applicable. No additional management support is needed unless otherwise documented below in the visit note. 

## 2014-04-26 NOTE — Progress Notes (Signed)
Subjective:    Patient ID: Lucas Khan, male    DOB: 01/18/1931, 80 y.o.   MRN: [id number]  HPI Patient is a 80 y.o. male presenting for follow up. Pt was referred to HemeOnc for IDA and evaluation for infusion therapy of iron. He had that appointment on 04/11/2014, and the oncologist felt that iron infusion may not help, that the pt may have a chronic GI bleed or malignancy leading to anemia, and that blood transfusion would likely help symptoms, and he strongly felt that the pt and family should consider hospice care due to the pts severe dementia and difficulty ambulating. PTs Wife also states that she took the pt to another Internal medicine doctor 2 days ago requesting a second opinion following her husbands oncology appointment. She requested an abdominal xray due to the pts LLQ abdominal pain, which was done and showed a small amount of gas and no obstruction. It was recommended that the Pt go on liquid diet for 2 days, and pt is now no longer having abdominal pain. The patients wife is requesting that the pt no longer have to take metoprolol bc his BP is now low. Pt is still experiencing SOB with any activity, and pts wife believes that the metoprolol is hurting this. Pts wife states that pts EP had said a year ago that the PT could DC his metoprolol, however the PCP felt like he could not, and so they stayed on this. Pt denies fevers, chills, nausea, vomiting, diarrhea, chest pain, head ache, syncope.  Review of Systems As per HPI and are otherwise negative.     Past Medical History  Diagnosis Date  . Hyperlipidemia   . Hypertension   . Memory loss   . Mobitz type 2 second degree atrioventricular block   . Aortic stenosis     Mean gradient 23  6/13  . Pacemaker - [employer]     DOI-7/13    History   Social History  . Marital Status: Married    Spouse Name: Lucas ([place])    Number of Children: N/A  . Years of Education: College   Occupational History  . retired      Social History Main Topics  . Smoking status: Former Smoker    Types: Pipe    Quit date: 01/17/2010  . Smokeless tobacco: Never Used  . Alcohol Use: Yes     Comment: 1 glass of wine daily  . Drug Use: No  . Sexual Activity: Not Currently   Other Topics Concern  . Not on file   Social History Narrative   Patient lives at home with his spouse.   Caffeine Use: 2-3 cups daily    Past Surgical History  Procedure Laterality Date  . Appendectomy      Family History  Problem Relation Age of Onset  . Memory loss      Allergies  Allergen Reactions  . Penicillins     Wife doesn't know what type of reaction  . Amoxicillin     REACTION: unspecified  . Statins     REACTION: unspecified    Current Outpatient Prescriptions on File Prior to Visit  Medication Sig Dispense Refill  . calcium carbonate (OS-CAL) 600 MG TABS Take 600 mg by mouth daily.      . cyanocobalamin 500 MCG tablet Take 500 mcg by mouth daily.      . fish oil-omega-3 fatty acids 1000 MG capsule Take 3 g by mouth daily.       Marland Kitchen  Iron-Vit C-Vit B12-Folic Acid 100-250-0.025-1 MG TABS Take 1 capsule by mouth daily.  30 each  2  . magnesium 30 MG tablet Take 30 mg by mouth 2 (two) times daily.      . metoprolol succinate (TOPROL-XL) 25 MG 24 hr tablet Take 0.5 tablets (12.5 mg total) by mouth daily. Take with or immediately following a meal.  30 tablet  11  . Multiple Vitamin (MULTIVITAMIN) tablet Take 1 tablet by mouth daily.      Marland Kitchen PHOSPHATIDYLSERINE PO Take 1 tablet by mouth daily.       . saw palmetto 500 MG capsule Take 500 mg by mouth daily.       No current facility-administered medications on file prior to visit.    EXAM: BP 108/60  Pulse 60  Temp(Src) 97.5 F (36.4 C) (Oral)  Resp 18     Objective:   Physical Exam  Nursing note and vitals reviewed. Constitutional: He is oriented to person, place, and time. He appears well-developed and well-nourished. No distress.  HENT:  Head: Normocephalic  and atraumatic.  Eyes: Conjunctivae and EOM are normal. Pupils are equal, round, and reactive to light.  Neck: Normal range of motion.  Cardiovascular: Normal rate, regular rhythm and intact distal pulses.   Murmur heard. Pulmonary/Chest: Effort normal and breath sounds normal. No respiratory distress. He exhibits no tenderness.  Musculoskeletal: Normal range of motion.  Neurological: He is alert and oriented to person, place, and time.  Skin: Skin is warm and dry. No rash noted. He is not diaphoretic. No erythema. No pallor.  Psychiatric: He has a normal mood and affect. His behavior is normal. Judgment and thought content normal.       Lab Results  Component Value Date   WBC 7.6 12/18/2013   HGB 9.6* 12/18/2013   HCT 32.4* 12/18/2013   PLT 428.0* 12/18/2013   GLUCOSE 89 12/18/2013   CHOL 213* 12/18/2013   TRIG 63.0 12/18/2013   HDL 73.40 12/18/2013   LDLDIRECT 165.1 11/08/2012   LDLCALC 127* 12/18/2013   ALT 13 12/18/2013   AST 19 12/18/2013   NA 141 12/18/2013   K 5.1 12/18/2013   CL 107 12/18/2013   CREATININE 1.0 12/18/2013   BUN 20 12/18/2013   CO2 26 12/18/2013   TSH 2.25 12/18/2013   PSA 0.20 12/18/2013   INR 1.05 03/09/2013   HGBA1C 6.3* 03/09/2013         Assessment & Plan:  Lucas Khan was seen today for 1 month follow up.  Diagnoses and associated orders for this visit:  SOB (shortness of breath) Comments: Has been told to dc metoprolol in past by EP per PTs wife. DC metoprolol.   IDA (iron deficiency anemia) Comments: Continue Iron supplement by mouth, try various types to limit constipation. Continue magnesium supplement to relieve constipation. IFOB. - Fecal occult blood, imunochemical   Murmur heard- Pt with AS managed by cardiology.  Will schedule appointment to establish with Dr. Durene Cal.  Return precautions provided, and patient handout on shortness of breath.  Plan to follow up in 1 month with PCP to reassess, or for worsening or persistent symptoms despite treatment.  Patient  Instructions  Discontinue the Metoprolol now that you are already at 12.5mg  per day.  Continue oral iron supplements and continue magnesium supplement to help relieve the constipation.  Complete the lab study and return it to the lab for evaluation of blood in stool.  If emergency symptoms discussed during visit developed, seek medical attention immediately.  Followup in about 1 month with PCP to reassess, or for worsening or persistent symptoms despite treatment.

## 2014-04-26 NOTE — Patient Instructions (Addendum)
Discontinue the Metoprolol now that you are already at 12.5mg  per day.  Continue oral iron supplements and continue magnesium supplement to help relieve the constipation.  Complete the lab study and return it to the lab for evaluation of blood in stool.  If emergency symptoms discussed during visit developed, seek medical attention immediately.  Followup in about 1 month with PCP to reassess, or for worsening or persistent symptoms despite treatment.    Shortness of Breath Shortness of breath means you have trouble breathing. Shortness of breath needs medical care right away. HOME CARE   Do not smoke.  Avoid being around chemicals or things (paint fumes, dust) that may bother your breathing.  Rest as needed. Slowly begin your normal activities.  Only take medicines as told by your doctor.  Keep all doctor visits as told. GET HELP RIGHT AWAY IF:   Your shortness of breath gets worse.  You feel lightheaded, pass out (faint), or have a cough that is not helped by medicine.  You cough up blood.  You have pain with breathing.  You have pain in your chest, arms, shoulders, or belly (abdomen).  You have a fever.  You cannot walk up stairs or exercise the way you normally do.  You do not get better in the time expected.  You have a hard time doing normal activities even with rest.  You have problems with your medicines.  You have any new symptoms. MAKE SURE YOU:  Understand these instructions.  Will watch your condition.  Will get help right away if you are not doing well or get worse. Document Released: 03/23/2008 Document Revised: 10/10/2013 Document Reviewed: 12/21/2011 [facility] Patient Information 2015 [place], [place]. This information is not intended to replace advice given to you by your health care provider. Make sure you discuss any questions you have with your health care provider.

## 2014-04-27 LAB — FECAL OCCULT BLOOD, IMMUNOCHEMICAL: Fecal Occult Bld: POSITIVE — AB

## 2014-05-01 ENCOUNTER — Telehealth: Payer: Self-pay

## 2014-05-01 NOTE — Telephone Encounter (Signed)
Called and spoke with pt's wife about lab work.  Per pt's wife pt is weaker and has fallen off the bed.  Per Molli HazardMatthew advised that pt go to ED.  Pt's wife asked if tomorrow would be good and was advised that pt be seen today.  Pt's wife verbalized understanding.

## 2014-05-02 ENCOUNTER — Telehealth: Payer: Self-pay | Admitting: Internal Medicine

## 2014-05-02 ENCOUNTER — Telehealth: Payer: Self-pay | Admitting: *Deleted

## 2014-05-02 NOTE — Telephone Encounter (Signed)
[facility] Bert[facility] RuddyGorsuch agrees to be Attending.  Lucas IngYvette says that Lucas Khan's PCP Dr. Lovell SheehanJenkins has already agreed but thanks Dr. Bertis RuddyGorsuch for her help..Marland Kitchen

## 2014-05-02 NOTE — Telephone Encounter (Signed)
Hospice reports Wife has requested Hospice Services.  She asks if Dr. Bertis RuddyGorsuch will agree to be the Attending Physician?

## 2014-05-02 NOTE — Telephone Encounter (Signed)
Pt is needing a verbal order for the DNR and the ok for dr at hospice to sign the form. Pt and wife is requesting no DNR.

## 2014-05-02 NOTE — Telephone Encounter (Signed)
Need a order for Hospice.  And need to know who will be the attending and sign the order?? They need a callback ASAP so a hospice nurse can go see the patient.

## 2014-05-02 NOTE — Telephone Encounter (Signed)
Evette informed that Dr Lovell SheehanJenkins will be the attending

## 2014-05-04 ENCOUNTER — Telehealth: Payer: Self-pay | Admitting: Internal Medicine

## 2014-05-04 NOTE — Telephone Encounter (Signed)
There is a conflict in this passage and it states that the wife requests no DO NOT RESUSCITATE If the wife who is the power health care refuses a DO NOT RESUSCITATE and I cannot get 1

## 2014-05-04 NOTE — Telephone Encounter (Signed)
Pt family is requesting DNR order. Please call carol back with verbal order

## 2014-05-07 ENCOUNTER — Telehealth: Payer: Self-pay | Admitting: Internal Medicine

## 2014-05-07 NOTE — Telephone Encounter (Signed)
Duplicate message. 

## 2014-05-07 NOTE — Telephone Encounter (Signed)
Time of death 0219.  Lucas LairSuandrea could you please mark pt deceased in chart?  Thanks

## 2014-05-07 NOTE — Telephone Encounter (Signed)
FYI Pt has expired on 05-20-14

## 2014-05-07 NOTE — Telephone Encounter (Signed)
Patient expired yesterday

## 2014-05-07 NOTE — Telephone Encounter (Signed)
Left message for pt to call back  °

## 2014-05-09 ENCOUNTER — Encounter: Payer: Medicare HMO | Admitting: Internal Medicine

## 2014-05-09 ENCOUNTER — Telehealth: Payer: Self-pay | Admitting: Internal Medicine

## 2014-05-09 NOTE — Telephone Encounter (Signed)
[employer] needs the original death certificate w/ dr signature returned to them .  They cannot file a copy w/ the state. Need original back ASAP.  Another dr can sign as long as they are authorized to see his patients.   Original dropped off Tues, 7/21 in a [place] envelope. Call when ready. [phone]

## 2014-05-09 NOTE — Telephone Encounter (Signed)
Pt deceased

## 2014-05-19 DEATH — deceased

## 2014-05-21 ENCOUNTER — Ambulatory Visit: Payer: Medicare HMO | Admitting: Family Medicine

## 2014-07-02 ENCOUNTER — Ambulatory Visit: Payer: Medicare HMO | Admitting: Internal Medicine

## 2014-07-03 ENCOUNTER — Ambulatory Visit: Payer: Medicare HMO | Admitting: Family Medicine

## 2014-09-27 ENCOUNTER — Encounter (HOSPITAL_COMMUNITY): Payer: Self-pay | Admitting: Internal Medicine
